# Patient Record
Sex: Female | Born: 1950 | Race: White | Hispanic: No | Smoking: Never smoker
Health system: Southern US, Community
[De-identification: ages and names within clinical notes are randomized; demographics above are authoritative.]

## PROBLEM LIST (undated history)

## (undated) DIAGNOSIS — T7840XA Allergy, unspecified, initial encounter: Secondary | ICD-10-CM

## (undated) DIAGNOSIS — I00 Rheumatic fever without heart involvement: Secondary | ICD-10-CM

## (undated) DIAGNOSIS — C801 Malignant (primary) neoplasm, unspecified: Secondary | ICD-10-CM

## (undated) HISTORY — DX: Rheumatic fever without heart involvement: I00

## (undated) HISTORY — DX: Allergy, unspecified, initial encounter: T78.40XA

---

## 1999-07-09 HISTORY — PX: MOHS SURGERY: SUR867

## 2000-10-16 ENCOUNTER — Encounter: Payer: Self-pay | Admitting: Obstetrics and Gynecology

## 2000-10-16 ENCOUNTER — Encounter (INDEPENDENT_AMBULATORY_CARE_PROVIDER_SITE_OTHER): Payer: Self-pay | Admitting: *Deleted

## 2000-10-16 ENCOUNTER — Other Ambulatory Visit: Admission: RE | Admit: 2000-10-16 | Discharge: 2000-10-16 | Payer: Self-pay | Admitting: Obstetrics and Gynecology

## 2000-10-16 ENCOUNTER — Encounter: Admission: RE | Admit: 2000-10-16 | Discharge: 2000-10-16 | Payer: Self-pay | Admitting: Obstetrics and Gynecology

## 2000-11-03 ENCOUNTER — Encounter: Payer: Self-pay | Admitting: Oncology

## 2000-11-03 ENCOUNTER — Ambulatory Visit (HOSPITAL_COMMUNITY): Admission: RE | Admit: 2000-11-03 | Discharge: 2000-11-03 | Payer: Self-pay | Admitting: Oncology

## 2000-11-05 ENCOUNTER — Ambulatory Visit (HOSPITAL_COMMUNITY): Admission: RE | Admit: 2000-11-05 | Discharge: 2000-11-05 | Payer: Self-pay | Admitting: Oncology

## 2000-11-05 ENCOUNTER — Encounter: Payer: Self-pay | Admitting: Oncology

## 2000-11-10 ENCOUNTER — Encounter (INDEPENDENT_AMBULATORY_CARE_PROVIDER_SITE_OTHER): Payer: Self-pay | Admitting: *Deleted

## 2000-11-10 ENCOUNTER — Encounter: Payer: Self-pay | Admitting: General Surgery

## 2000-11-10 ENCOUNTER — Ambulatory Visit (HOSPITAL_BASED_OUTPATIENT_CLINIC_OR_DEPARTMENT_OTHER): Admission: RE | Admit: 2000-11-10 | Discharge: 2000-11-10 | Payer: Self-pay | Admitting: General Surgery

## 2000-11-18 ENCOUNTER — Encounter: Payer: Self-pay | Admitting: General Surgery

## 2000-11-18 ENCOUNTER — Encounter: Admission: RE | Admit: 2000-11-18 | Discharge: 2000-11-18 | Payer: Self-pay | Admitting: General Surgery

## 2001-01-25 ENCOUNTER — Ambulatory Visit (HOSPITAL_COMMUNITY): Admission: RE | Admit: 2001-01-25 | Discharge: 2001-01-25 | Payer: Self-pay | Admitting: Oncology

## 2001-02-05 HISTORY — PX: BREAST LUMPECTOMY: SHX2

## 2001-02-06 ENCOUNTER — Encounter: Payer: Self-pay | Admitting: General Surgery

## 2001-02-06 ENCOUNTER — Ambulatory Visit (HOSPITAL_BASED_OUTPATIENT_CLINIC_OR_DEPARTMENT_OTHER): Admission: RE | Admit: 2001-02-06 | Discharge: 2001-02-06 | Payer: Self-pay | Admitting: General Surgery

## 2001-02-06 ENCOUNTER — Encounter (INDEPENDENT_AMBULATORY_CARE_PROVIDER_SITE_OTHER): Payer: Self-pay | Admitting: *Deleted

## 2001-02-06 ENCOUNTER — Encounter: Admission: RE | Admit: 2001-02-06 | Discharge: 2001-02-06 | Payer: Self-pay | Admitting: General Surgery

## 2001-02-23 ENCOUNTER — Ambulatory Visit: Admission: RE | Admit: 2001-02-23 | Discharge: 2001-05-24 | Payer: Self-pay | Admitting: Radiation Oncology

## 2001-10-06 ENCOUNTER — Encounter: Admission: RE | Admit: 2001-10-06 | Discharge: 2001-10-06 | Payer: Self-pay | Admitting: General Surgery

## 2001-10-06 ENCOUNTER — Encounter: Payer: Self-pay | Admitting: General Surgery

## 2002-04-09 ENCOUNTER — Encounter: Payer: Self-pay | Admitting: Oncology

## 2002-04-09 ENCOUNTER — Encounter: Admission: RE | Admit: 2002-04-09 | Discharge: 2002-04-09 | Payer: Self-pay | Admitting: Oncology

## 2002-04-28 ENCOUNTER — Other Ambulatory Visit: Admission: RE | Admit: 2002-04-28 | Discharge: 2002-04-28 | Payer: Self-pay | Admitting: Obstetrics and Gynecology

## 2002-10-11 ENCOUNTER — Encounter: Admission: RE | Admit: 2002-10-11 | Discharge: 2002-10-11 | Payer: Self-pay | Admitting: Oncology

## 2002-10-11 ENCOUNTER — Encounter: Payer: Self-pay | Admitting: Oncology

## 2003-11-15 ENCOUNTER — Ambulatory Visit (HOSPITAL_COMMUNITY): Admission: RE | Admit: 2003-11-15 | Discharge: 2003-11-15 | Payer: Self-pay | Admitting: Oncology

## 2003-12-07 ENCOUNTER — Encounter: Admission: RE | Admit: 2003-12-07 | Discharge: 2003-12-07 | Payer: Self-pay | Admitting: Oncology

## 2004-05-12 ENCOUNTER — Ambulatory Visit: Payer: Self-pay | Admitting: Oncology

## 2004-05-15 ENCOUNTER — Other Ambulatory Visit: Admission: RE | Admit: 2004-05-15 | Discharge: 2004-05-15 | Payer: Self-pay | Admitting: Obstetrics and Gynecology

## 2004-12-07 ENCOUNTER — Ambulatory Visit: Payer: Self-pay | Admitting: Oncology

## 2004-12-11 ENCOUNTER — Encounter: Admission: RE | Admit: 2004-12-11 | Discharge: 2004-12-11 | Payer: Self-pay | Admitting: Oncology

## 2005-06-07 ENCOUNTER — Ambulatory Visit: Payer: Self-pay | Admitting: Oncology

## 2005-12-03 ENCOUNTER — Ambulatory Visit: Payer: Self-pay | Admitting: Oncology

## 2005-12-09 ENCOUNTER — Ambulatory Visit (HOSPITAL_COMMUNITY): Admission: RE | Admit: 2005-12-09 | Discharge: 2005-12-09 | Payer: Self-pay | Admitting: Oncology

## 2005-12-09 LAB — COMPREHENSIVE METABOLIC PANEL
ALT: <8 U/L (ref 0–40)
AST: 17 U/L (ref 0–37)
Alkaline Phosphatase: 65 U/L (ref 39–117)
Creatinine, Ser: 0.68 mg/dL (ref 0.40–1.20)
Sodium: 141 mEq/L (ref 135–145)
Total Bilirubin: 0.8 mg/dL (ref 0.3–1.2)
Total Protein: 7.1 g/dL (ref 6.0–8.3)

## 2005-12-09 LAB — LACTATE DEHYDROGENASE: LDH: 116 U/L (ref 94–250)

## 2005-12-09 LAB — CBC WITH DIFFERENTIAL/PLATELET
BASO%: 0.3 % (ref 0.0–2.0)
EOS%: 1.7 % (ref 0.0–7.0)
HCT: 38.2 % (ref 34.8–46.6)
LYMPH%: 23.6 % (ref 14.0–48.0)
MCH: 30.6 pg (ref 26.0–34.0)
MCHC: 33.8 g/dL (ref 32.0–36.0)
MONO#: 0.4 10*3/uL (ref 0.1–0.9)
MONO%: 8.4 % (ref 0.0–13.0)
NEUT%: 66 % (ref 39.6–76.8)
Platelets: 296 10*3/uL (ref 145–400)
RBC: 4.23 10*6/uL (ref 3.70–5.32)
WBC: 5.2 10*3/uL (ref 3.9–10.0)

## 2005-12-09 LAB — ESTRADIOL: Estradiol: <10 pg/mL

## 2005-12-13 ENCOUNTER — Encounter: Admission: RE | Admit: 2005-12-13 | Discharge: 2005-12-13 | Payer: Self-pay | Admitting: Oncology

## 2006-01-16 ENCOUNTER — Encounter: Admission: RE | Admit: 2006-01-16 | Discharge: 2006-01-16 | Payer: Self-pay | Admitting: Oncology

## 2006-06-19 ENCOUNTER — Ambulatory Visit: Payer: Self-pay | Admitting: Oncology

## 2006-06-23 LAB — CBC WITH DIFFERENTIAL/PLATELET
BASO%: 0.5 % (ref 0.0–2.0)
EOS%: 1.4 % (ref 0.0–7.0)
LYMPH%: 24.2 % (ref 14.0–48.0)
MCH: 30.3 pg (ref 26.0–34.0)
MCHC: 33.9 g/dL (ref 32.0–36.0)
MCV: 89.3 fL (ref 81.0–101.0)
MONO%: 9.2 % (ref 0.0–13.0)
Platelets: 312 10*3/uL (ref 145–400)
RBC: 4.16 10*6/uL (ref 3.70–5.32)
RDW: 12.8 % (ref 11.3–14.5)

## 2006-06-23 LAB — COMPREHENSIVE METABOLIC PANEL
ALT: 9 U/L (ref 0–35)
AST: 22 U/L (ref 0–37)
Alkaline Phosphatase: 64 U/L (ref 39–117)
Glucose, Bld: 100 mg/dL — ABNORMAL HIGH (ref 70–99)
Sodium: 140 mEq/L (ref 135–145)
Total Bilirubin: 0.6 mg/dL (ref 0.3–1.2)
Total Protein: 7 g/dL (ref 6.0–8.3)

## 2006-06-23 LAB — CANCER ANTIGEN 27.29: CA 27.29: 20 U/mL (ref 0–39)

## 2006-07-04 LAB — ESTRADIOL, ULTRA SENS: Estradiol, Ultra Sensitive: 48 pg/mL

## 2006-12-10 ENCOUNTER — Ambulatory Visit: Payer: Self-pay | Admitting: Oncology

## 2006-12-15 LAB — COMPREHENSIVE METABOLIC PANEL
Albumin: 4.4 g/dL (ref 3.5–5.2)
Alkaline Phosphatase: 75 U/L (ref 39–117)
BUN: 8 mg/dL (ref 6–23)
Calcium: 9.3 mg/dL (ref 8.4–10.5)
Creatinine, Ser: 0.65 mg/dL (ref 0.40–1.20)
Glucose, Bld: 102 mg/dL — ABNORMAL HIGH (ref 70–99)
Potassium: 3.3 mEq/L — ABNORMAL LOW (ref 3.5–5.3)

## 2006-12-15 LAB — CBC WITH DIFFERENTIAL/PLATELET
Basophils Absolute: 0 10*3/uL (ref 0.0–0.1)
EOS%: 1.5 % (ref 0.0–7.0)
Eosinophils Absolute: 0.1 10*3/uL (ref 0.0–0.5)
HCT: 38.5 % (ref 34.8–46.6)
HGB: 13.4 g/dL (ref 11.6–15.9)
MCH: 30.2 pg (ref 26.0–34.0)
MCV: 86.9 fL (ref 81.0–101.0)
MONO%: 14.8 % — ABNORMAL HIGH (ref 0.0–13.0)
NEUT#: 2.6 10*3/uL (ref 1.5–6.5)
NEUT%: 47.3 % (ref 39.6–76.8)
Platelets: 314 10*3/uL (ref 145–400)
RDW: 13.3 % (ref 11.3–14.5)

## 2006-12-15 LAB — CANCER ANTIGEN 27.29: CA 27.29: 22 U/mL (ref 0–39)

## 2006-12-17 ENCOUNTER — Encounter: Admission: RE | Admit: 2006-12-17 | Discharge: 2006-12-17 | Payer: Self-pay | Admitting: Oncology

## 2006-12-22 LAB — FSH/LH: FSH: 25.6 m[IU]/mL

## 2006-12-29 LAB — ESTRADIOL, ULTRA SENS: Estradiol, Ultra Sensitive: <2 pg/mL

## 2007-06-01 ENCOUNTER — Emergency Department (HOSPITAL_COMMUNITY): Admission: EM | Admit: 2007-06-01 | Discharge: 2007-06-01 | Payer: Self-pay | Admitting: Emergency Medicine

## 2007-06-11 ENCOUNTER — Ambulatory Visit: Payer: Self-pay | Admitting: Oncology

## 2007-06-15 LAB — COMPREHENSIVE METABOLIC PANEL
ALT: 13 U/L (ref 0–35)
AST: 22 U/L (ref 0–37)
Albumin: 4.3 g/dL (ref 3.5–5.2)
Alkaline Phosphatase: 69 U/L (ref 39–117)
BUN: 8 mg/dL (ref 6–23)
CO2: 27 mEq/L (ref 19–32)
Calcium: 9.4 mg/dL (ref 8.4–10.5)
Chloride: 106 mEq/L (ref 96–112)
Creatinine, Ser: 0.69 mg/dL (ref 0.40–1.20)
Glucose, Bld: 98 mg/dL (ref 70–99)
Potassium: 3.6 mEq/L (ref 3.5–5.3)
Sodium: 144 mEq/L (ref 135–145)
Total Bilirubin: 1.1 mg/dL (ref 0.3–1.2)
Total Protein: 7.4 g/dL (ref 6.0–8.3)

## 2007-06-15 LAB — CBC WITH DIFFERENTIAL/PLATELET
BASO%: 1.4 % (ref 0.0–2.0)
Basophils Absolute: 0.1 10*3/uL (ref 0.0–0.1)
EOS%: 0.9 % (ref 0.0–7.0)
Eosinophils Absolute: 0.1 10*3/uL (ref 0.0–0.5)
HCT: 39.4 % (ref 34.8–46.6)
HGB: 13.6 g/dL (ref 11.6–15.9)
LYMPH%: 30.4 % (ref 14.0–48.0)
MCH: 30.2 pg (ref 26.0–34.0)
MCHC: 34.6 g/dL (ref 32.0–36.0)
MCV: 87.3 fL (ref 81.0–101.0)
MONO#: 0.4 10*3/uL (ref 0.1–0.9)
MONO%: 5.6 % (ref 0.0–13.0)
NEUT#: 4.3 10*3/uL (ref 1.5–6.5)
NEUT%: 61.7 % (ref 39.6–76.8)
Platelets: 334 10*3/uL (ref 145–400)
RBC: 4.51 10*6/uL (ref 3.70–5.32)
RDW: 13.8 % (ref 11.3–14.5)
WBC: 6.9 10*3/uL (ref 3.9–10.0)
lymph#: 2.1 10*3/uL (ref 0.9–3.3)

## 2007-06-15 LAB — LACTATE DEHYDROGENASE: LDH: 140 U/L (ref 94–250)

## 2007-06-15 LAB — FSH/LH
FSH: 37 m[IU]/mL
LH: 31.5 m[IU]/mL

## 2007-08-24 ENCOUNTER — Ambulatory Visit: Payer: Self-pay | Admitting: Oncology

## 2007-12-16 ENCOUNTER — Ambulatory Visit: Payer: Self-pay | Admitting: Oncology

## 2007-12-18 LAB — CBC WITH DIFFERENTIAL/PLATELET
BASO%: 0.6 % (ref 0.0–2.0)
Basophils Absolute: 0 10*3/uL (ref 0.0–0.1)
EOS%: 1.4 % (ref 0.0–7.0)
HCT: 38 % (ref 34.8–46.6)
HGB: 13.1 g/dL (ref 11.6–15.9)
MCH: 29.4 pg (ref 26.0–34.0)
MONO#: 0.5 10*3/uL (ref 0.1–0.9)
RDW: 12.8 % (ref 11.3–14.5)
WBC: 6.7 10*3/uL (ref 3.9–10.0)
lymph#: 1.8 10*3/uL (ref 0.9–3.3)

## 2007-12-18 LAB — COMPREHENSIVE METABOLIC PANEL
AST: 20 U/L (ref 0–37)
Albumin: 4.4 g/dL (ref 3.5–5.2)
Alkaline Phosphatase: 83 U/L (ref 39–117)
Calcium: 9.5 mg/dL (ref 8.4–10.5)
Chloride: 105 mEq/L (ref 96–112)
Glucose, Bld: 113 mg/dL — ABNORMAL HIGH (ref 70–99)
Potassium: 3.4 mEq/L — ABNORMAL LOW (ref 3.5–5.3)
Sodium: 142 mEq/L (ref 135–145)
Total Protein: 7.4 g/dL (ref 6.0–8.3)

## 2007-12-21 ENCOUNTER — Encounter: Admission: RE | Admit: 2007-12-21 | Discharge: 2007-12-21 | Payer: Self-pay | Admitting: Oncology

## 2008-01-25 ENCOUNTER — Encounter: Admission: RE | Admit: 2008-01-25 | Discharge: 2008-01-25 | Payer: Self-pay | Admitting: Oncology

## 2008-06-20 IMAGING — MG MM DIAGNOSTIC BILATERAL
5 series · 5 of 5 positions shown · non-contrast
Comparison: [DATE] [DATE], [DATE], [DATE] [DATE], [DATE]

CLINICAL DATA: Personal history of right breast cancer status post
lumpectomy 8778

DIGITAL DIAGNOSTIC BILATERAL MAMMOGRAM WITH COMPUTER AIDED
DETECTION December 21, 2007

[R CC]
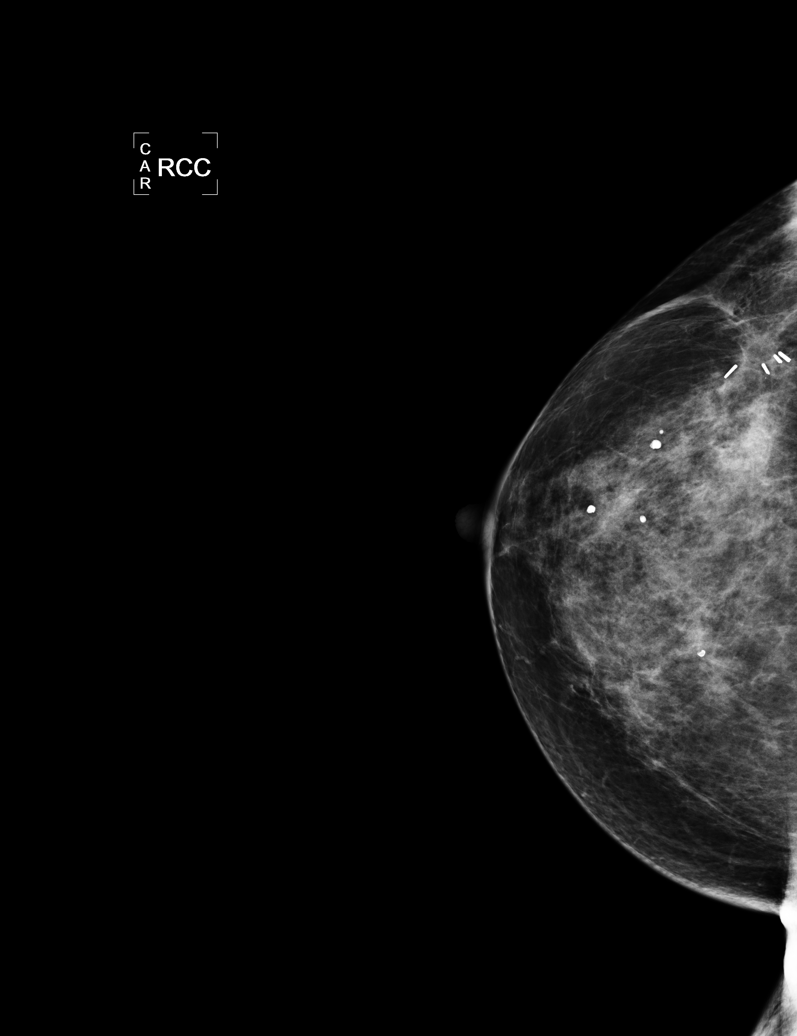

[L CC]
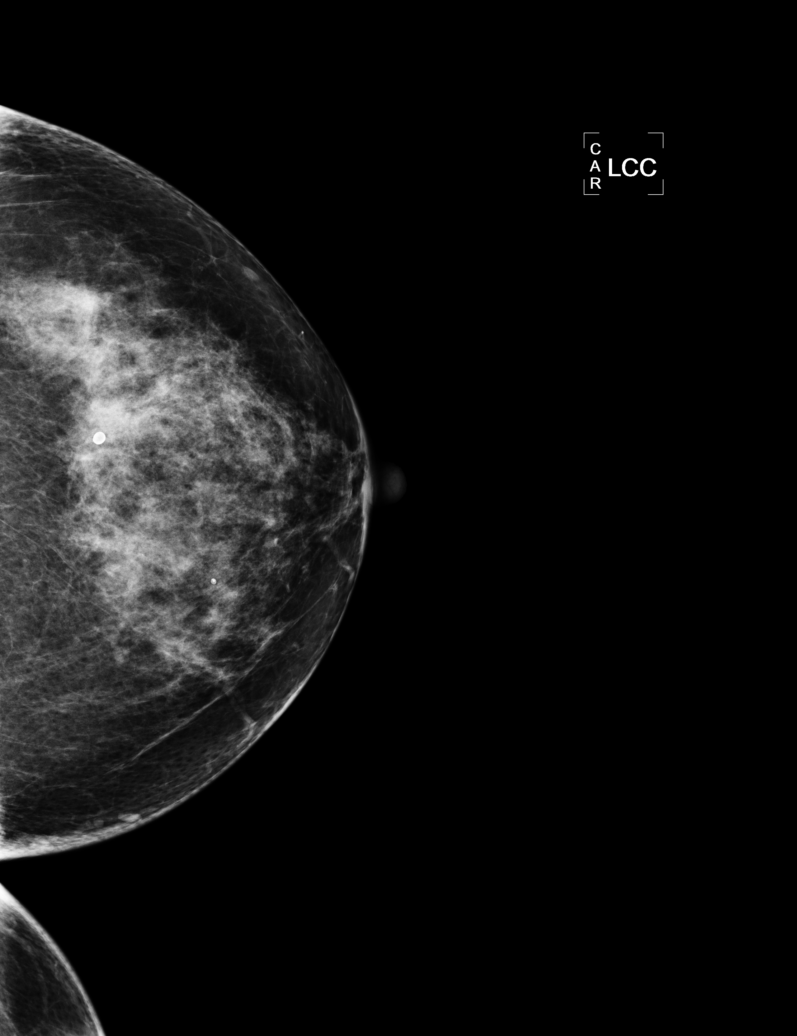

[L MLO]
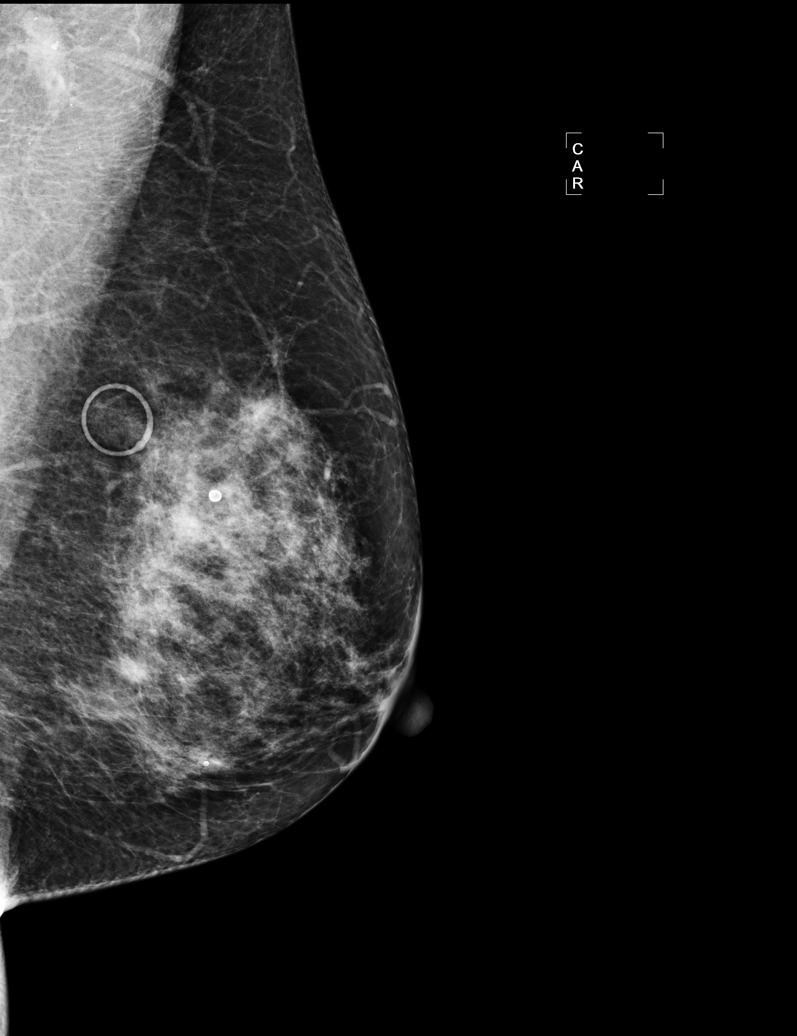

[R MLO]
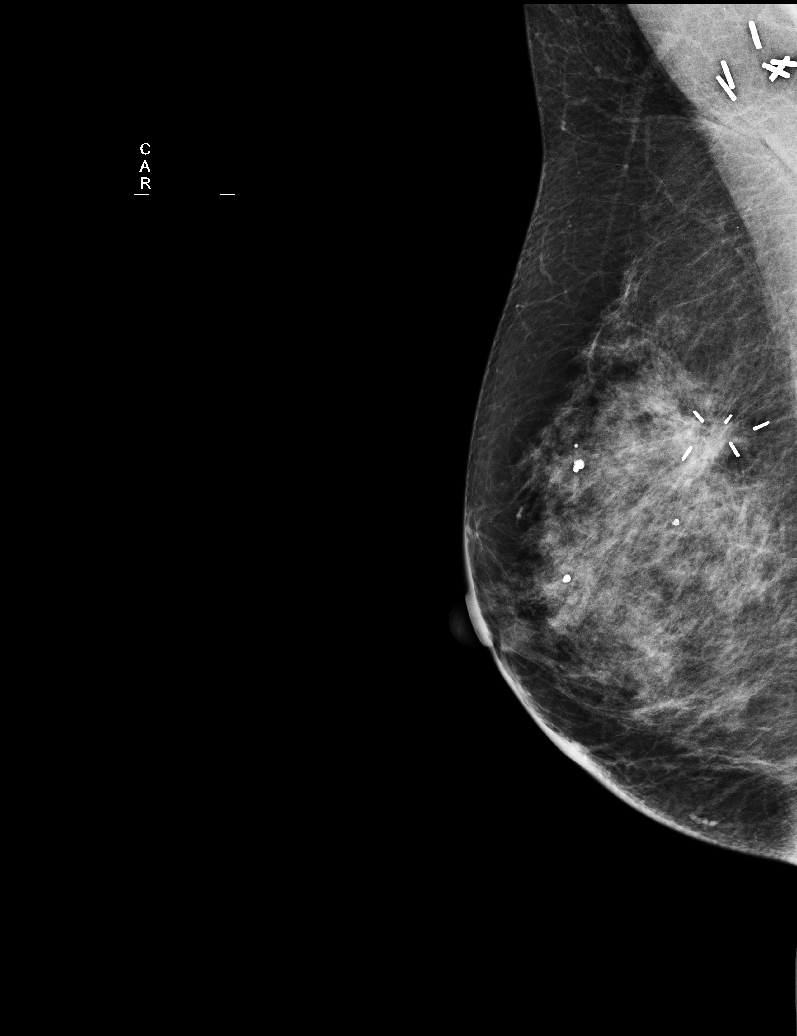

[R TAN]
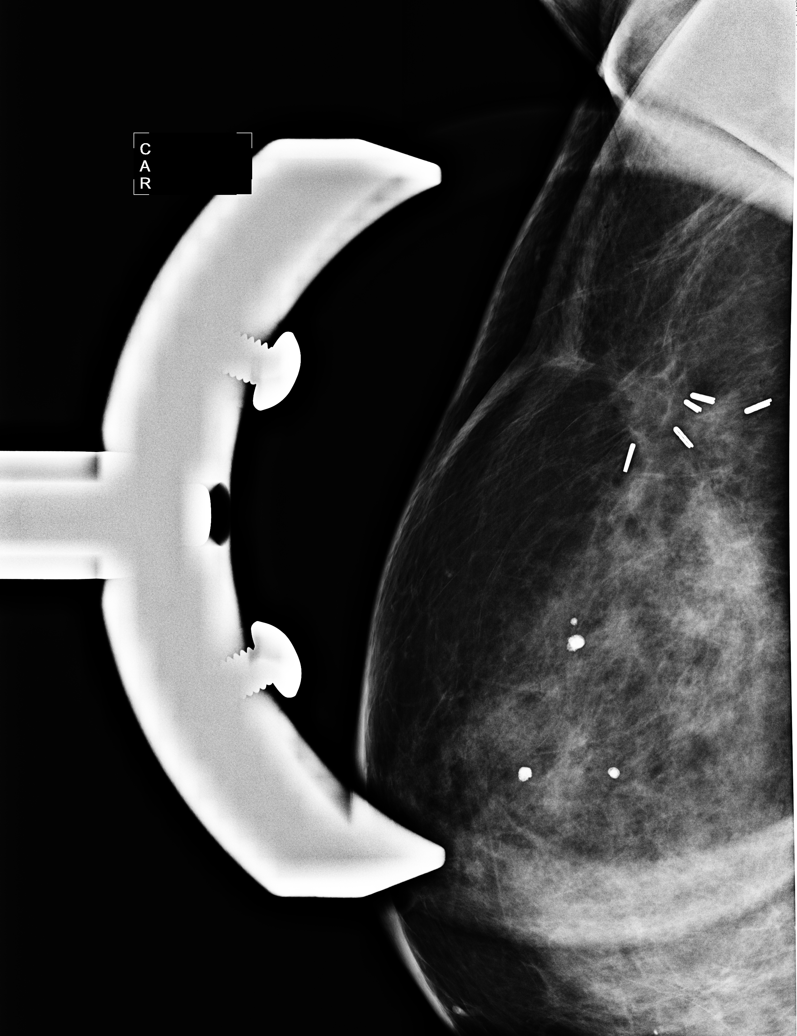

[5 of 5 positions shown; findings below may reference images not displayed]

FINDINGS: CC and MLO views of bilateral breast and spot tangential
view of right breast are submitted reviewed.  Breast parenchyma is
heterogeneously dense lowering the sensitivity of mammography.
Stable postsurgical scar is noted in the upper outer quadrant right
breast without change compared prior exam.  The left breast is
stable.
IMPRESSION: Benign findings, recommend bilateral screening mammogram in 1 year

BI-RADS CATEGORY 2:  Benign finding(s).

## 2008-12-13 ENCOUNTER — Ambulatory Visit: Payer: Self-pay | Admitting: Oncology

## 2008-12-15 LAB — CBC WITH DIFFERENTIAL/PLATELET
Basophils Absolute: 0 10*3/uL (ref 0.0–0.1)
EOS%: 1.2 % (ref 0.0–7.0)
Eosinophils Absolute: 0.1 10*3/uL (ref 0.0–0.5)
HCT: 37.5 % (ref 34.8–46.6)
HGB: 13.2 g/dL (ref 11.6–15.9)
LYMPH%: 20.9 % (ref 14.0–49.7)
MCH: 30.8 pg (ref 25.1–34.0)
MCV: 87.6 fL (ref 79.5–101.0)
MONO%: 7.6 % (ref 0.0–14.0)
NEUT#: 5.4 10*3/uL (ref 1.5–6.5)
NEUT%: 69.7 % (ref 38.4–76.8)
Platelets: 280 10*3/uL (ref 145–400)
RDW: 12.8 % (ref 11.2–14.5)

## 2008-12-16 LAB — COMPREHENSIVE METABOLIC PANEL
AST: 18 U/L (ref 0–37)
Albumin: 4.3 g/dL (ref 3.5–5.2)
Alkaline Phosphatase: 78 U/L (ref 39–117)
BUN: 11 mg/dL (ref 6–23)
Creatinine, Ser: 0.89 mg/dL (ref 0.40–1.20)
Glucose, Bld: 91 mg/dL (ref 70–99)
Potassium: 3.8 mEq/L (ref 3.5–5.3)
Total Bilirubin: 0.8 mg/dL (ref 0.3–1.2)

## 2008-12-16 LAB — CANCER ANTIGEN 27.29: CA 27.29: 28 U/mL (ref 0–39)

## 2008-12-16 LAB — VITAMIN D 25 HYDROXY (VIT D DEFICIENCY, FRACTURES): Vit D, 25-Hydroxy: 28 ng/mL — ABNORMAL LOW (ref 30–89)

## 2008-12-21 ENCOUNTER — Encounter: Admission: RE | Admit: 2008-12-21 | Discharge: 2008-12-21 | Payer: Self-pay | Admitting: Oncology

## 2009-12-06 HISTORY — PX: BASAL CELL CARCINOMA EXCISION: SHX1214

## 2009-12-15 ENCOUNTER — Ambulatory Visit: Payer: Self-pay | Admitting: Oncology

## 2009-12-19 LAB — LACTATE DEHYDROGENASE: LDH: 126 U/L (ref 94–250)

## 2009-12-19 LAB — CBC WITH DIFFERENTIAL/PLATELET
BASO%: 0.5 % (ref 0.0–2.0)
MCH: 30.1 pg (ref 25.1–34.0)
MONO#: 0.6 10*3/uL (ref 0.1–0.9)
MONO%: 8.2 % (ref 0.0–14.0)
NEUT%: 55 % (ref 38.4–76.8)
RBC: 4.37 10*6/uL (ref 3.70–5.45)
lymph#: 2.6 10*3/uL (ref 0.9–3.3)

## 2009-12-19 LAB — COMPREHENSIVE METABOLIC PANEL
ALT: 13 U/L (ref 0–35)
AST: 24 U/L (ref 0–37)
Alkaline Phosphatase: 68 U/L (ref 39–117)
Chloride: 104 mEq/L (ref 96–112)
Creatinine, Ser: 0.8 mg/dL (ref 0.40–1.20)
Glucose, Bld: 88 mg/dL (ref 70–99)
Potassium: 3.5 mEq/L (ref 3.5–5.3)
Sodium: 141 mEq/L (ref 135–145)
Total Bilirubin: 1.2 mg/dL (ref 0.3–1.2)
Total Protein: 7.6 g/dL (ref 6.0–8.3)

## 2009-12-22 ENCOUNTER — Encounter: Admission: RE | Admit: 2009-12-22 | Discharge: 2009-12-22 | Payer: Self-pay | Admitting: Oncology

## 2009-12-28 ENCOUNTER — Encounter: Admission: RE | Admit: 2009-12-28 | Discharge: 2009-12-28 | Payer: Self-pay | Admitting: Oncology

## 2010-01-09 ENCOUNTER — Ambulatory Visit (HOSPITAL_COMMUNITY): Admission: RE | Admit: 2010-01-09 | Discharge: 2010-01-09 | Payer: Self-pay | Admitting: Oncology

## 2010-01-12 ENCOUNTER — Ambulatory Visit: Admission: RE | Admit: 2010-01-12 | Discharge: 2010-04-12 | Payer: Self-pay | Admitting: Radiation Oncology

## 2010-01-15 ENCOUNTER — Ambulatory Visit: Payer: Self-pay | Admitting: Oncology

## 2010-01-15 LAB — CBC WITH DIFFERENTIAL/PLATELET
Basophils Absolute: 0 10*3/uL (ref 0.0–0.1)
EOS%: 0.5 % (ref 0.0–7.0)
Eosinophils Absolute: 0 10*3/uL (ref 0.0–0.5)
HGB: 12.7 g/dL (ref 11.6–15.9)
LYMPH%: 28.5 % (ref 14.0–49.7)
MCH: 30.5 pg (ref 25.1–34.0)
MCHC: 34.7 g/dL (ref 31.5–36.0)
MONO%: 9.4 % (ref 0.0–14.0)
NEUT%: 61.2 % (ref 38.4–76.8)
Platelets: 294 10*3/uL (ref 145–400)
RBC: 4.16 10*6/uL (ref 3.70–5.45)
RDW: 12.8 % (ref 11.2–14.5)

## 2010-01-15 LAB — COMPREHENSIVE METABOLIC PANEL
Albumin: 3.9 g/dL (ref 3.5–5.2)
Alkaline Phosphatase: 66 U/L (ref 39–117)
BUN: 6 mg/dL (ref 6–23)
Calcium: 9.4 mg/dL (ref 8.4–10.5)
Chloride: 107 mEq/L (ref 96–112)
Creatinine, Ser: 0.67 mg/dL (ref 0.40–1.20)
Total Bilirubin: 0.9 mg/dL (ref 0.3–1.2)

## 2010-01-15 LAB — LACTATE DEHYDROGENASE: LDH: 122 U/L (ref 94–250)

## 2010-02-13 ENCOUNTER — Ambulatory Visit (HOSPITAL_BASED_OUTPATIENT_CLINIC_OR_DEPARTMENT_OTHER): Admission: RE | Admit: 2010-02-13 | Discharge: 2010-02-14 | Payer: Self-pay | Admitting: Surgery

## 2010-04-13 ENCOUNTER — Ambulatory Visit: Admission: RE | Admit: 2010-04-13 | Discharge: 2010-05-07 | Payer: Self-pay | Admitting: Radiation Oncology

## 2010-05-09 ENCOUNTER — Ambulatory Visit: Payer: Self-pay | Admitting: Oncology

## 2010-07-27 ENCOUNTER — Other Ambulatory Visit: Payer: Self-pay | Admitting: Oncology

## 2010-07-27 DIAGNOSIS — C50919 Malignant neoplasm of unspecified site of unspecified female breast: Secondary | ICD-10-CM

## 2010-07-30 ENCOUNTER — Encounter: Payer: Self-pay | Admitting: Oncology

## 2010-09-07 ENCOUNTER — Other Ambulatory Visit (HOSPITAL_COMMUNITY): Payer: Self-pay

## 2010-10-17 ENCOUNTER — Encounter (HOSPITAL_BASED_OUTPATIENT_CLINIC_OR_DEPARTMENT_OTHER): Payer: BLUE CROSS/BLUE SHIELD | Admitting: Oncology

## 2010-10-17 ENCOUNTER — Encounter (HOSPITAL_COMMUNITY): Payer: Self-pay

## 2010-10-17 ENCOUNTER — Other Ambulatory Visit: Payer: Self-pay | Admitting: Oncology

## 2010-10-17 ENCOUNTER — Encounter (HOSPITAL_COMMUNITY)
Admission: RE | Admit: 2010-10-17 | Discharge: 2010-10-17 | Disposition: A | Discharge status: Home / Self Care | Payer: BLUE CROSS/BLUE SHIELD | Source: Ambulatory Visit | Attending: Oncology | Admitting: Oncology

## 2010-10-17 DIAGNOSIS — C50919 Malignant neoplasm of unspecified site of unspecified female breast: Secondary | ICD-10-CM

## 2010-10-17 DIAGNOSIS — C761 Malignant neoplasm of thorax: Secondary | ICD-10-CM | POA: Insufficient documentation

## 2010-10-17 DIAGNOSIS — C50419 Malignant neoplasm of upper-outer quadrant of unspecified female breast: Secondary | ICD-10-CM

## 2010-10-17 DIAGNOSIS — N289 Disorder of kidney and ureter, unspecified: Secondary | ICD-10-CM | POA: Insufficient documentation

## 2010-10-17 DIAGNOSIS — Z9221 Personal history of antineoplastic chemotherapy: Secondary | ICD-10-CM | POA: Insufficient documentation

## 2010-10-17 DIAGNOSIS — C44611 Basal cell carcinoma of skin of unspecified upper limb, including shoulder: Secondary | ICD-10-CM | POA: Insufficient documentation

## 2010-10-17 DIAGNOSIS — K449 Diaphragmatic hernia without obstruction or gangrene: Secondary | ICD-10-CM | POA: Insufficient documentation

## 2010-10-17 DIAGNOSIS — Z79899 Other long term (current) drug therapy: Secondary | ICD-10-CM | POA: Insufficient documentation

## 2010-10-17 DIAGNOSIS — R05 Cough: Secondary | ICD-10-CM | POA: Insufficient documentation

## 2010-10-17 DIAGNOSIS — R059 Cough, unspecified: Secondary | ICD-10-CM | POA: Insufficient documentation

## 2010-10-17 DIAGNOSIS — J8410 Pulmonary fibrosis, unspecified: Secondary | ICD-10-CM | POA: Insufficient documentation

## 2010-10-17 DIAGNOSIS — Z923 Personal history of irradiation: Secondary | ICD-10-CM | POA: Insufficient documentation

## 2010-10-17 HISTORY — DX: Malignant (primary) neoplasm, unspecified: C80.1

## 2010-10-17 LAB — CMP (CANCER CENTER ONLY)
Albumin: 3.7 g/dL (ref 3.3–5.5)
BUN, Bld: 11 mg/dL (ref 7–22)
CO2: 29 mEq/L (ref 18–33)
Calcium: 9.3 mg/dL (ref 8.0–10.3)
Chloride: 100 mEq/L (ref 98–108)
Glucose, Bld: 99 mg/dL (ref 73–118)
Potassium: 3.5 mEq/L (ref 3.3–4.7)

## 2010-10-17 LAB — CBC WITH DIFFERENTIAL/PLATELET
Basophils Absolute: 0 10*3/uL (ref 0.0–0.1)
Eosinophils Absolute: 0.1 10*3/uL (ref 0.0–0.5)
HGB: 13.2 g/dL (ref 11.6–15.9)
NEUT#: 3.3 10*3/uL (ref 1.5–6.5)
RDW: 13.5 % (ref 11.2–14.5)
lymph#: 1.4 10*3/uL (ref 0.9–3.3)

## 2010-10-17 LAB — CANCER ANTIGEN 27.29: CA 27.29: 30 U/mL (ref 0–39)

## 2010-10-17 MED ORDER — FLUDEOXYGLUCOSE F - 18 (FDG) INJECTION
14.5000 | Freq: Once | INTRAVENOUS | Status: AC | PRN
  Administered 2010-10-17: 14.5 via INTRAVENOUS

## 2010-10-17 MED ORDER — IOHEXOL 300 MG/ML  SOLN
100.0000 mL | Freq: Once | INTRAMUSCULAR | Status: AC | PRN
  Administered 2010-10-17: 100 mL via INTRAVENOUS

## 2010-10-26 ENCOUNTER — Other Ambulatory Visit: Payer: Self-pay | Admitting: Oncology

## 2010-10-26 ENCOUNTER — Encounter (HOSPITAL_BASED_OUTPATIENT_CLINIC_OR_DEPARTMENT_OTHER): Payer: BLUE CROSS/BLUE SHIELD | Admitting: Oncology

## 2010-10-26 DIAGNOSIS — C50919 Malignant neoplasm of unspecified site of unspecified female breast: Secondary | ICD-10-CM

## 2010-10-26 DIAGNOSIS — Z1231 Encounter for screening mammogram for malignant neoplasm of breast: Secondary | ICD-10-CM

## 2010-11-05 ENCOUNTER — Ambulatory Visit: Payer: BLUE CROSS/BLUE SHIELD | Attending: Radiation Oncology | Admitting: Radiation Oncology

## 2010-11-23 NOTE — Op Note (Signed)
[place]  Patient:    Jennifer Hopkins, Jennifer Hopkins                 MRN: [id number] Proc. Date: 11/10/00 Adm. Date:  [id number] Attending:  Janalyn Rouse CC:         Pierce Crane, M.D.   Operative Report  PREOPERATIVE DIAGNOSIS:  T2 carcinoma of the right breast.  POSTOPERATIVE DIAGNOSIS:  T2 carcinoma of the right breast.  OPERATION PERFORMED: 1. Blue dye injection. 2. Right axillary sentinel lymph node biopsy.  SURGEON:  Rose Phi. Maple Hudson, M.D.  ANESTHESIA:  General.  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  She is very interested in having breast conservation therapy and wanted preoperative chemotherapy.  In order to do this, Dr. Donnie Coffin preferred that we do a sentinel node biopsy to establish her nodal status.  DESCRIPTION OF PROCEDURE:  Prior to coming to the operating room, 1 mCi of technetium sulfur colloid was injected intradermally around the areola.  We injected 5 cc Lymphazurin blue in the subareolar tissue and massaged the breast for 5 minutes.  After suitable general anesthesia was induced, the patient was placed in the supine position with the right breast prepped and draped in the usual fashion.  Having injected the blue dye and massaged the breast, we then scanned the internal mammary, the supraclavicular and the axillary areas with the Neoprobe.  There was one hot spot in the axilla.  A short transverse incision was made in the axilla with dissection through the subcutaneous tissues to the clavipectoral fascia.  Blue lymphatics could be identified and we carefully dissected those into about three blue and hot lymph nodes.  We clipped the afferent and efferent lymphatics and removed what appeared to be three lymph nodes.  There were no other hot, blue or palpable nodes.   These were submitted to the pathologist.  With good hemostasis, the subcutaneous tissues were closed with 3-0 Vicryl and the skin with the subcuticular 4-0 Monocryl and Steri-Strips.  Dressing applied.  The patient was transferred to the recovery room in satisfactory condition having tolerated the procedure well. DD:  11/10/00 TD:  11/11/00 Job: [id number]

## 2010-11-23 NOTE — Op Note (Signed)
[place]. [facility]  Patient:    Jennifer Hopkins, Jennifer Hopkins                 MRN: [id number] Proc. Date: 02/06/01 Adm. Date:  [id number] Disc. Date: [id number] Attending:  Janalyn Rouse CC:         Pierce Crane, M.D.   Operative Report  PREOPERATIVE DIAGNOSIS:  T2, N0 carcinoma of the right breast.  POSTOPERATIVE DIAGNOSIS:  T2, N0 carcinoma of the right breast.  OPERATION:  Right partial mastectomy with needle localization and specimen mammography.  SURGEON:  Rose Phi. Maple Hudson, M.D.  ANESTHESIA:  General.  DESCRIPTION OF PROCEDURE:  This 59 year old female had presented with a large palpable mass at the 10 oclock position of her right breast which, on core biopsy, was invasive ductal carcinoma.  A sentinel node biopsy was done which was negative.  She was treated with four cycles of Adriamycin-Cytoxan chemotherapy with total resolution, clinically, of the right breast mass, and we had placed a clip in the middle of the area of the tumor for lateral identification.  We are now doing a needle localized right partial mastectomy.  After suitable general anesthesia was induced, the patient was placed in the supine position with the right arm extended on the arm board.  A curved incision was made centered where the previously placed wire had been, and a wide excision was carried out incorporating the wire and the surrounding tissue.  The specimen was oriented for the pathologist, and several clips were placed to mark the margins in the lumpectomy cavity.  Touch preps on the margin showed them to be negative.  We had good hemostasis.  Subcuticular closure with 4-0 Monocryl and Steri-Strips carried out.  Dressing applied. The patient was transferred to the recovery room in satisfactory condition having tolerated the procedure well. DD:  02/06/01 TD:  02/08/01 Job: [id number]

## 2010-12-24 ENCOUNTER — Ambulatory Visit
Admission: RE | Admit: 2010-12-24 | Discharge: 2010-12-24 | Disposition: A | Discharge status: Home / Self Care | Payer: BLUE CROSS/BLUE SHIELD | Source: Ambulatory Visit | Attending: Oncology | Admitting: Oncology

## 2010-12-24 DIAGNOSIS — Z1231 Encounter for screening mammogram for malignant neoplasm of breast: Secondary | ICD-10-CM

## 2010-12-25 ENCOUNTER — Other Ambulatory Visit: Payer: Self-pay | Admitting: Oncology

## 2010-12-25 DIAGNOSIS — R928 Other abnormal and inconclusive findings on diagnostic imaging of breast: Secondary | ICD-10-CM

## 2011-01-01 ENCOUNTER — Ambulatory Visit
Admission: RE | Admit: 2011-01-01 | Discharge: 2011-01-01 | Disposition: A | Discharge status: Home / Self Care | Payer: BLUE CROSS/BLUE SHIELD | Source: Ambulatory Visit | Attending: Oncology | Admitting: Oncology

## 2011-01-01 DIAGNOSIS — R928 Other abnormal and inconclusive findings on diagnostic imaging of breast: Secondary | ICD-10-CM

## 2011-04-16 LAB — DIFFERENTIAL
Eosinophils Absolute: 0 — ABNORMAL LOW
Eosinophils Relative: 0
Lymphs Abs: 0.4 — ABNORMAL LOW
Monocytes Absolute: 0.4

## 2011-04-16 LAB — URINALYSIS, ROUTINE W REFLEX MICROSCOPIC
Bilirubin Urine: NEGATIVE
Glucose, UA: NEGATIVE
Protein, ur: NEGATIVE

## 2011-04-16 LAB — BASIC METABOLIC PANEL
BUN: 12
Calcium: 8.4
GFR calc non Af Amer: >60
Glucose, Bld: 107 — ABNORMAL HIGH

## 2011-04-16 LAB — CBC
Platelets: 242
RDW: 13.9

## 2011-04-19 ENCOUNTER — Encounter (HOSPITAL_BASED_OUTPATIENT_CLINIC_OR_DEPARTMENT_OTHER): Payer: BC Managed Care – PPO | Admitting: Oncology

## 2011-04-19 ENCOUNTER — Other Ambulatory Visit: Payer: Self-pay | Admitting: Oncology

## 2011-04-19 ENCOUNTER — Other Ambulatory Visit (HOSPITAL_COMMUNITY): Payer: BLUE CROSS/BLUE SHIELD

## 2011-04-19 DIAGNOSIS — C50419 Malignant neoplasm of upper-outer quadrant of unspecified female breast: Secondary | ICD-10-CM

## 2011-04-19 LAB — CBC WITH DIFFERENTIAL/PLATELET
BASO%: 0.3 % (ref 0.0–2.0)
Basophils Absolute: 0 10*3/uL (ref 0.0–0.1)
EOS%: 0.6 % (ref 0.0–7.0)
HCT: 36.8 % (ref 34.8–46.6)
LYMPH%: 24.7 % (ref 14.0–49.7)
MCH: 30.3 pg (ref 25.1–34.0)
MCHC: 34.2 g/dL (ref 31.5–36.0)
MCV: 88.8 fL (ref 79.5–101.0)
MONO%: 8.5 % (ref 0.0–14.0)
NEUT%: 65.9 % (ref 38.4–76.8)
Platelets: 282 10*3/uL (ref 145–400)
lymph#: 1.2 10*3/uL (ref 0.9–3.3)

## 2011-04-20 LAB — COMPREHENSIVE METABOLIC PANEL
ALT: <8 U/L (ref 0–35)
AST: 17 U/L (ref 0–37)
Alkaline Phosphatase: 71 U/L (ref 39–117)
BUN: 10 mg/dL (ref 6–23)
Creatinine, Ser: 0.67 mg/dL (ref 0.50–1.10)
Total Bilirubin: 1 mg/dL (ref 0.3–1.2)

## 2011-04-26 ENCOUNTER — Encounter (HOSPITAL_BASED_OUTPATIENT_CLINIC_OR_DEPARTMENT_OTHER): Payer: BC Managed Care – PPO | Admitting: Oncology

## 2011-04-26 DIAGNOSIS — C50419 Malignant neoplasm of upper-outer quadrant of unspecified female breast: Secondary | ICD-10-CM

## 2011-06-12 ENCOUNTER — Telehealth: Payer: Self-pay | Admitting: *Deleted

## 2011-06-12 NOTE — Telephone Encounter (Signed)
patient confirmed over the phone the new date and time on 08-28-2011 starting at 11:00am

## 2011-07-11 ENCOUNTER — Encounter: Payer: Self-pay | Admitting: Oncology

## 2011-07-11 ENCOUNTER — Encounter (HOSPITAL_COMMUNITY)
Admission: RE | Admit: 2011-07-11 | Discharge: 2011-07-11 | Disposition: A | Discharge status: Home / Self Care | Payer: BC Managed Care – PPO | Source: Ambulatory Visit | Attending: Oncology | Admitting: Oncology

## 2011-07-11 ENCOUNTER — Encounter (HOSPITAL_COMMUNITY): Payer: Self-pay

## 2011-07-11 DIAGNOSIS — C50919 Malignant neoplasm of unspecified site of unspecified female breast: Secondary | ICD-10-CM

## 2011-07-11 DIAGNOSIS — C4492 Squamous cell carcinoma of skin, unspecified: Secondary | ICD-10-CM | POA: Insufficient documentation

## 2011-07-11 MED ORDER — FLUDEOXYGLUCOSE F - 18 (FDG) INJECTION
18.6000 | Freq: Once | INTRAVENOUS | Status: AC | PRN
  Administered 2011-07-11: 18.6 via INTRAVENOUS

## 2011-07-11 MED ORDER — IOHEXOL 300 MG/ML  SOLN
100.0000 mL | Freq: Once | INTRAMUSCULAR | Status: AC | PRN
  Administered 2011-07-11: 100 mL via INTRAVENOUS

## 2011-07-12 ENCOUNTER — Encounter: Payer: Self-pay | Admitting: Radiation Oncology

## 2011-07-12 NOTE — Progress Notes (Signed)
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 Right lumpectomy 02/06/2001 chemo/radiation  Tamoxifen x 5 years. Radiation:03/28/2010 thru 05/07/2010 left axilla 5040 cGy in 28 fractions.

## 2011-07-15 ENCOUNTER — Ambulatory Visit
Admission: RE | Admit: 2011-07-15 | Discharge: 2011-07-15 | Disposition: A | Discharge status: Home / Self Care | Payer: BC Managed Care – PPO | Source: Ambulatory Visit | Attending: Radiation Oncology | Admitting: Radiation Oncology

## 2011-07-15 VITALS — BP 135/87 | HR 102 | Temp 97.5°F | Wt 138.0 lb

## 2011-07-15 DIAGNOSIS — C50911 Malignant neoplasm of unspecified site of right female breast: Secondary | ICD-10-CM

## 2011-07-15 DIAGNOSIS — Z853 Personal history of malignant neoplasm of breast: Secondary | ICD-10-CM

## 2011-07-15 DIAGNOSIS — C50919 Malignant neoplasm of unspecified site of unspecified female breast: Secondary | ICD-10-CM

## 2011-07-15 NOTE — Progress Notes (Signed)
CC:   Pierce Crane, M.D., F.R.C.P.C. Currie Paris, M.D. Huel Cote, M.D.  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  INTERVAL SINCE RADIATION THERAPY:  1 year and 2 months for the patient's recurrent skin cancer.  NARRATIVE:  Jennifer Hopkins comes in today for routine followup.  She clinically seems to be doing quite well at this time.  The patient occasionally will have some itching along the skin grafted area in the left upper shoulder region.  The patient will be seeing Dr. Donnie Coffin in February.  The patient prior to this visit did undergo a CT scan of the chest, abdomen, and pelvis as well as PET scan earlier this month. There was no evidence of recurrence of the patient's skin cancer or breast cancer on her imaging studies.  The patient denies any pain in the breast area, nipple discharge, or bleeding.  The patient denies any new bony pain, headaches, dizziness, or blurred vision.  The patient denies any problems with swelling in her left arm or hand.  The patient most recently was treated with radiation therapy directed at the left axillary area in light of her recurrent skin cancer.  PHYSICAL EXAMINATION:  Vital Signs:  The patient's temperature is 97.5, pulse is 102, blood pressure is 135/87, weight is 138 pounds.  Skin: Examination of the left shoulder area reveals skin graft area without palpable or visible signs of recurrence.  There are some telangiectasias noted in this area.  The neck, supraclavicular, and axillary areas are free of adenopathy.  There is mild induration along the left axillary region from the patient's radiation and surgical changes.  Breasts: Examination of the right breast reveals no palpable mass or nipple discharge.  The patient has an excellent cosmetic result.  There is a faint scar in the upper outer quadrant.  Examination of the left breast reveals no palpable mass or nipple  discharge.  IMPRESSION AND PLAN:  Clinically and radiographically no evident disease.  As above, the patient will be seeing Dr. Donnie Coffin in February. The patient will undergo mammography in the summertime.  In light of the patient's close followup with Dr. Donnie Coffin, I have not scheduled Jennifer Hopkins for a formal followup appointment, but will be glad to see her at any time.    ______________________________ Billie Lade, Ph.D., M.D. JDK/MEDQ  D:  07/15/2011  T:  07/15/2011  Job:  [id number]

## 2011-07-15 NOTE — Progress Notes (Signed)
Completed radiation ot left axilla 1 year ago. Denies any problems.skin completely healed. Ct and Pet completed last week.

## 2011-08-28 ENCOUNTER — Other Ambulatory Visit (HOSPITAL_BASED_OUTPATIENT_CLINIC_OR_DEPARTMENT_OTHER): Payer: BC Managed Care – PPO | Admitting: Lab

## 2011-08-28 ENCOUNTER — Telehealth: Payer: Self-pay | Admitting: Oncology

## 2011-08-28 ENCOUNTER — Ambulatory Visit (HOSPITAL_BASED_OUTPATIENT_CLINIC_OR_DEPARTMENT_OTHER): Payer: BC Managed Care – PPO | Admitting: Oncology

## 2011-08-28 VITALS — BP 136/83 | HR 96 | Temp 98.0°F | Ht 62.0 in | Wt 131.8 lb

## 2011-08-28 DIAGNOSIS — C4491 Basal cell carcinoma of skin, unspecified: Secondary | ICD-10-CM

## 2011-08-28 DIAGNOSIS — Z8589 Personal history of malignant neoplasm of other organs and systems: Secondary | ICD-10-CM

## 2011-08-28 DIAGNOSIS — C50419 Malignant neoplasm of upper-outer quadrant of unspecified female breast: Secondary | ICD-10-CM

## 2011-08-28 DIAGNOSIS — Z853 Personal history of malignant neoplasm of breast: Secondary | ICD-10-CM

## 2011-08-28 DIAGNOSIS — C50919 Malignant neoplasm of unspecified site of unspecified female breast: Secondary | ICD-10-CM

## 2011-08-28 DIAGNOSIS — E559 Vitamin D deficiency, unspecified: Secondary | ICD-10-CM

## 2011-08-28 LAB — CBC WITH DIFFERENTIAL/PLATELET
Basophils Absolute: 0 10*3/uL (ref 0.0–0.1)
Eosinophils Absolute: 0 10*3/uL (ref 0.0–0.5)
HGB: 13.8 g/dL (ref 11.6–15.9)
LYMPH%: 29.1 % (ref 14.0–49.7)
MCV: 88 fL (ref 79.5–101.0)
MONO#: 0.5 10*3/uL (ref 0.1–0.9)
MONO%: 8.8 % (ref 0.0–14.0)
NEUT#: 3.2 10*3/uL (ref 1.5–6.5)
Platelets: 285 10*3/uL (ref 145–400)
RBC: 4.53 10*6/uL (ref 3.70–5.45)
RDW: 12.9 % (ref 11.2–14.5)
WBC: 5.2 10*3/uL (ref 3.9–10.3)

## 2011-08-28 NOTE — Telephone Encounter (Signed)
gve the pt her aug 2013 appt calendar along with the ct scan appt/mammo appt

## 2011-08-29 LAB — COMPREHENSIVE METABOLIC PANEL
Albumin: 4.9 g/dL (ref 3.5–5.2)
Alkaline Phosphatase: 73 U/L (ref 39–117)
BUN: 8 mg/dL (ref 6–23)
CO2: 25 mEq/L (ref 19–32)
Glucose, Bld: 104 mg/dL — ABNORMAL HIGH (ref 70–99)
Potassium: 3.5 mEq/L (ref 3.5–5.3)
Sodium: 143 mEq/L (ref 135–145)
Total Protein: 7.9 g/dL (ref 6.0–8.3)

## 2011-08-29 LAB — LACTATE DEHYDROGENASE: LDH: 145 U/L (ref 94–250)

## 2011-08-29 LAB — VITAMIN D 25 HYDROXY (VIT D DEFICIENCY, FRACTURES): Vit D, 25-Hydroxy: 61 ng/mL (ref 30–89)

## 2011-09-03 ENCOUNTER — Other Ambulatory Visit: Payer: Self-pay | Admitting: Oncology

## 2011-09-03 DIAGNOSIS — Z1231 Encounter for screening mammogram for malignant neoplasm of breast: Secondary | ICD-10-CM

## 2011-09-05 ENCOUNTER — Telehealth: Payer: Self-pay | Admitting: Oncology

## 2011-09-05 NOTE — Telephone Encounter (Signed)
lmonvm adviisng the pt of her mammo appt

## 2011-10-23 ENCOUNTER — Encounter: Payer: Self-pay | Admitting: *Deleted

## 2011-10-23 NOTE — Progress Notes (Unsigned)
RX FOR NEXIUM AND ANASTROZOLE HAVE BEEN FAXED TO [facility], PER PT REQUEST REQUEST

## 2011-10-29 NOTE — Progress Notes (Signed)
Hematology and Oncology Follow Up Visit  Jennifer Hopkins [id number] Nov 03, 1950 61 y.o. 10/29/2011 4:24 PM PCP  1. Principle Diagnosis: History of T2 N0 breast cancer, status post 4 cycles of Adriamycin/Taxotere completed 07/19/_02_________, status post lumpectomy with completion of CMF chemotherapy on 04/20/2001, on tamoxifen for 5 years, completed in October 2007.   History of basal cell carcinoma with recurrence, left axillary region, status post resection on 12/27/2009 and radiation therapy to the area completed October 2011.  Interim History:  There have been no intercurrent illness, hospitalizations or medication changes. She's been feeling well. She's had no complaints. She is traveling probably is due to go to [place] in the next month or so.  Medications: I have reviewed the patient's current medications.  Allergies: No Known Allergies  Past Medical History, Surgical history, Social history, and Family History were reviewed and updated.  Review of Systems: Constitutional:  Negative for fever, chills, night sweats, anorexia, weight loss, pain. Cardiovascular: no chest pain or dyspnea on exertion Respiratory: no cough, shortness of breath, or wheezing Neurological: no TIA or stroke symptoms Dermatological: negative ENT: negative Skin Gastrointestinal: negative Genito-Urinary: negative Hematological and Lymphatic: negative Breast: negative Musculoskeletal: negative Remaining ROS negative.  Physical Exam: Blood pressure 136/83, pulse 96, temperature 98 F (36.7 C), height 5\' 2"  (1.575 m), weight 131 lb 12.8 oz (59.784 kg). ECOG:  General appearance: alert, cooperative and appears stated age Head: Normocephalic, without obvious abnormality, atraumatic Neck: no adenopathy, no carotid bruit, no JVD, supple, symmetrical, trachea midline and thyroid not enlarged, symmetric, no tenderness/mass/nodules Lymph nodes: Cervical, supraclavicular, and axillary nodes  normal. Cardiac : regular rate and rhythm, no murmurs or gallops Pulmonary:clear to auscultation bilaterally and normal percussion bilaterally Breasts: inspection negative, no nipple discharge or bleeding, no masses or nodularity palpable, exam status post surgery the scars have healed well and there is no obvious evidence of local recurrence Abdomen:soft, non-tender; bowel sounds normal; no masses,  no organomegaly Extremities negative Neuro: alert, oriented, normal speech, no focal findings or movement disorder noted  Lab Results: Lab Results  Component Value Date   WBC 5.2 08/28/2011   HGB 13.8 08/28/2011   HCT 39.8 08/28/2011   MCV 88.0 08/28/2011   PLT 285 08/28/2011     Chemistry      Component Value Date/Time   NA 143 08/28/2011 1110   NA 141 10/17/2010 0806   K 3.5 08/28/2011 1110   K 3.5 10/17/2010 0806   CL 103 08/28/2011 1110   CL 100 10/17/2010 0806   CO2 25 08/28/2011 1110   CO2 29 10/17/2010 0806   BUN 8 08/28/2011 1110   BUN 11 10/17/2010 0806   CREATININE 0.69 08/28/2011 1110   CREATININE 0.8 10/17/2010 0806      Component Value Date/Time   CALCIUM 9.7 08/28/2011 1110   CALCIUM 9.3 10/17/2010 0806   ALKPHOS 73 08/28/2011 1110   ALKPHOS 77 10/17/2010 0806   AST 20 08/28/2011 1110   AST 24 10/17/2010 0806   ALT 10 08/28/2011 1110   BILITOT 1.1 08/28/2011 1110   BILITOT 1.40 10/17/2010 0806      .pathology. Radiological Studies: chest X-ray n/a Mammogram Due 6/13 Bone density Due   Impression and Plan: Patient is doing well. She is 61 and has had a distant history of breast cancer and more recent history of recurrent basal cell carcinoma involving the left axilla. She is clinically free of disease. I will see her in 6 months time with appropriate imaging studies.  More  than 50% of the visit was spent in patient-related counselling   Pierce Crane, MD 4/23/20134:24 PM

## 2012-01-01 ENCOUNTER — Ambulatory Visit
Admission: RE | Admit: 2012-01-01 | Discharge: 2012-01-01 | Disposition: A | Discharge status: Home / Self Care | Payer: BC Managed Care – PPO | Source: Ambulatory Visit | Attending: Oncology | Admitting: Oncology

## 2012-01-01 DIAGNOSIS — Z1231 Encounter for screening mammogram for malignant neoplasm of breast: Secondary | ICD-10-CM

## 2012-02-25 ENCOUNTER — Ambulatory Visit (HOSPITAL_COMMUNITY)
Admission: RE | Admit: 2012-02-25 | Discharge: 2012-02-25 | Disposition: A | Discharge status: Home / Self Care | Payer: BC Managed Care – PPO | Source: Ambulatory Visit | Attending: Oncology | Admitting: Oncology

## 2012-02-25 ENCOUNTER — Other Ambulatory Visit (HOSPITAL_BASED_OUTPATIENT_CLINIC_OR_DEPARTMENT_OTHER): Payer: BC Managed Care – PPO | Admitting: Lab

## 2012-02-25 DIAGNOSIS — Z9221 Personal history of antineoplastic chemotherapy: Secondary | ICD-10-CM | POA: Insufficient documentation

## 2012-02-25 DIAGNOSIS — C50919 Malignant neoplasm of unspecified site of unspecified female breast: Secondary | ICD-10-CM

## 2012-02-25 DIAGNOSIS — E559 Vitamin D deficiency, unspecified: Secondary | ICD-10-CM

## 2012-02-25 DIAGNOSIS — Z853 Personal history of malignant neoplasm of breast: Secondary | ICD-10-CM

## 2012-02-25 DIAGNOSIS — C4491 Basal cell carcinoma of skin, unspecified: Secondary | ICD-10-CM

## 2012-02-25 DIAGNOSIS — J984 Other disorders of lung: Secondary | ICD-10-CM | POA: Insufficient documentation

## 2012-02-25 DIAGNOSIS — Z923 Personal history of irradiation: Secondary | ICD-10-CM | POA: Insufficient documentation

## 2012-02-25 LAB — CBC WITH DIFFERENTIAL/PLATELET
BASO%: 0.4 % (ref 0.0–2.0)
EOS%: 0.4 % (ref 0.0–7.0)
MCH: 30.3 pg (ref 25.1–34.0)
MCHC: 33.8 g/dL (ref 31.5–36.0)
MONO#: 0.6 10*3/uL (ref 0.1–0.9)
RBC: 4.44 10*6/uL (ref 3.70–5.45)
RDW: 13 % (ref 11.2–14.5)
WBC: 6.8 10*3/uL (ref 3.9–10.3)
lymph#: 1.9 10*3/uL (ref 0.9–3.3)

## 2012-02-25 LAB — CMP (CANCER CENTER ONLY)
ALT(SGPT): 19 U/L (ref 10–47)
AST: 25 U/L (ref 11–38)
Albumin: 4.1 g/dL (ref 3.3–5.5)
Alkaline Phosphatase: 89 U/L — ABNORMAL HIGH (ref 26–84)
BUN, Bld: 9 mg/dL (ref 7–22)
Chloride: 99 mEq/L (ref 98–108)
Potassium: 3.4 mEq/L (ref 3.3–4.7)
Sodium: 143 mEq/L (ref 128–145)
Total Protein: 8 g/dL (ref 6.4–8.1)

## 2012-02-25 MED ORDER — IOHEXOL 300 MG/ML  SOLN
80.0000 mL | Freq: Once | INTRAMUSCULAR | Status: AC | PRN
  Administered 2012-02-25: 80 mL via INTRAVENOUS

## 2012-02-26 LAB — VITAMIN D 25 HYDROXY (VIT D DEFICIENCY, FRACTURES): Vit D, 25-Hydroxy: 46 ng/mL (ref 30–89)

## 2012-03-03 ENCOUNTER — Ambulatory Visit (HOSPITAL_BASED_OUTPATIENT_CLINIC_OR_DEPARTMENT_OTHER): Payer: BC Managed Care – PPO | Admitting: Oncology

## 2012-03-03 VITALS — BP 149/92 | HR 106 | Temp 98.5°F | Resp 20 | Ht 62.0 in | Wt 135.3 lb

## 2012-03-03 DIAGNOSIS — C50919 Malignant neoplasm of unspecified site of unspecified female breast: Secondary | ICD-10-CM

## 2012-03-03 DIAGNOSIS — Z853 Personal history of malignant neoplasm of breast: Secondary | ICD-10-CM

## 2012-03-03 DIAGNOSIS — Z8589 Personal history of malignant neoplasm of other organs and systems: Secondary | ICD-10-CM

## 2012-03-03 DIAGNOSIS — C4491 Basal cell carcinoma of skin, unspecified: Secondary | ICD-10-CM

## 2012-03-03 NOTE — Progress Notes (Signed)
Hematology and Oncology Follow Up Visit  Jennifer Hopkins [id number] [birth year] 61 y.o. 03/03/2012 3:55 PM   DIAGNOSIS:   No diagnosis found.   PAST THERAPY:  1. History of T2 N0 breast cancer, status post 4 cycles of Adriamycin/Taxotere completed 07/19/__________, status post lumpectomy with completion of CMF chemotherapy on 04/20/2001, on tamoxifen for 5 years, completed in October 2007.   2. History of basal cell carcinoma with recurrence, left axillary region, status post resection on 12/27/2009 and radiation therapy to the area completed October 2011.  Interim History: She is been doing well. She has no intercurrent problems. She had a recent CT scan of imaging studies. She is planning a trip to [place] next year.  Medications: I have reviewed the patient's current medications.  Allergies: No Known Allergies  Past Medical History, Surgical history, Social history, and Family History were reviewed and updated.  Review of Systems: Constitutional:  Negative for fever, chills, night sweats, anorexia, weight loss, pain. Cardiovascular: no chest pain or dyspnea on exertion Respiratory: no cough, shortness of breath, or wheezing Neurological: no TIA or stroke symptoms Dermatological: negative ENT: negative Skin Gastrointestinal: negative Genito-Urinary: negative Hematological and Lymphatic: negative Breast: negative Musculoskeletal: negative Remaining ROS negative.  Physical Exam:  Blood pressure 149/92, pulse 106, temperature 98.5 F (36.9 C), temperature source Oral, resp. rate 20, height 5\' 2"  (1.575 m), weight 135 lb 4.8 oz (61.372 kg).  ECOG: 0  HEENT:  Sclerae anicteric, conjunctivae pink.  Oropharynx clear.  No mucositis or candidiasis.  Nodes:  No cervical, supraclavicular, or axillary lymphadenopathy palpated.  Breast Exam:  Right breast is benign.  No masses, discharge, skin change, or nipple inversion.  Left breast is benign.  No masses, discharge, skin change,  or nipple inversion.[name] left axilla is negative. There is no obvious evidence of local recurrence. Lungs:  Clear to auscultation bilaterally.  No crackles, rhonchi, or wheezes.  Heart:  Regular rate and rhythm.  Abdomen:  Soft, nontender.  Positive bowel sounds.  No organomegaly or masses palpated.  Musculoskeletal:  No focal spinal tenderness to palpation.  Extremities:  Benign.  No peripheral edema or cyanosis.  Skin:  Benign.  Neuro:  Nonfocal.    Lab Results: Lab Results  Component Value Date   WBC 6.8 02/25/2012   HGB 13.4 02/25/2012   HCT 39.8 02/25/2012   MCV 89.7 02/25/2012   PLT 310 02/25/2012     Chemistry      Component Value Date/Time   NA 143 02/25/2012 1458   NA 143 08/28/2011 1110   K 3.4 02/25/2012 1458   K 3.5 08/28/2011 1110   CL 99 02/25/2012 1458   CL 103 08/28/2011 1110   CO2 29 02/25/2012 1458   CO2 25 08/28/2011 1110   BUN 9 02/25/2012 1458   BUN 8 08/28/2011 1110   CREATININE 0.7 02/25/2012 1458   CREATININE 0.69 08/28/2011 1110      Component Value Date/Time   CALCIUM 9.4 02/25/2012 1458   CALCIUM 9.7 08/28/2011 1110   ALKPHOS 89* 02/25/2012 1458   ALKPHOS 73 08/28/2011 1110   AST 25 02/25/2012 1458   AST 20 08/28/2011 1110   ALT 10 08/28/2011 1110   BILITOT 1.20 02/25/2012 1458   BILITOT 1.1 08/28/2011 1110       Radiological Studies:  Ct Chest W Contrast  02/25/2012  *RADIOLOGY REPORT*  Clinical Data:  Follow-up breast carcinoma and basal cell carcinoma.  Previous chemotherapy and radiation therapy.  CT CHEST, ABDOMEN AND PELVIS WITH CONTRAST  Technique:  Multidetector CT imaging of the chest, abdomen and pelvis was performed following the standard protocol during bolus administration of intravenous contrast.  Contrast: 80mL OMNIPAQUE IOHEXOL 300 MG/ML  SOLN  Comparison:  07/11/2011  CT CHEST  Findings:  No evidence of mediastinal or hilar masses.  No adenopathy seen elsewhere within the thorax.  No evidence of chest wall mass.  Surgical clips seen in both axillary  regions.  No evidence of pleural or pericardial effusion.  Mild scarring again seen in the right middle and left lower lobes.  No suspicious pulmonary nodules or masses are identified.  No evidence of pulmonary infiltrate or central endobronchial lesion.  No suspicious bone lesions are identified.  IMPRESSION: Stable exam.  No evidence of recurrent or metastatic carcinoma or other acute findings.  CT ABDOMEN AND PELVIS  Findings:  The abdominal parenchymal organs remain normal in appearance.  Gallbladder is unremarkable.  No evidence of hydronephrosis.  No soft tissue masses or lymphadenopathy identified within the the abdomen or pelvis.  Uterus and adnexae are unremarkable in appearance.  No evidence of inflammatory process or abnormal fluid collections. No evidence of bowel wall thickening or dilatation.  Normal appendix visualized.  No suspicious bone lesions are identified.  IMPRESSION: Stable exam.  No evidence of metastatic disease or other acute findings.   Original Report Authenticated By: Danae Orleans, M.D.    Ct Abdomen Pelvis W Contrast  02/25/2012  *RADIOLOGY REPORT*  Clinical Data:  Follow-up breast carcinoma and basal cell carcinoma.  Previous chemotherapy and radiation therapy.  CT CHEST, ABDOMEN AND PELVIS WITH CONTRAST  Technique:  Multidetector CT imaging of the chest, abdomen and pelvis was performed following the standard protocol during bolus administration of intravenous contrast.  Contrast: 80mL OMNIPAQUE IOHEXOL 300 MG/ML  SOLN  Comparison:  07/11/2011  CT CHEST  Findings:  No evidence of mediastinal or hilar masses.  No adenopathy seen elsewhere within the thorax.  No evidence of chest wall mass.  Surgical clips seen in both axillary regions.  No evidence of pleural or pericardial effusion.  Mild scarring again seen in the right middle and left lower lobes.  No suspicious pulmonary nodules or masses are identified.  No evidence of pulmonary infiltrate or central endobronchial lesion.  No  suspicious bone lesions are identified.  IMPRESSION: Stable exam.  No evidence of recurrent or metastatic carcinoma or other acute findings.  CT ABDOMEN AND PELVIS  Findings:  The abdominal parenchymal organs remain normal in appearance.  Gallbladder is unremarkable.  No evidence of hydronephrosis.  No soft tissue masses or lymphadenopathy identified within the the abdomen or pelvis.  Uterus and adnexae are unremarkable in appearance.  No evidence of inflammatory process or abnormal fluid collections. No evidence of bowel wall thickening or dilatation.  Normal appendix visualized.  No suspicious bone lesions are identified.  IMPRESSION: Stable exam.  No evidence of metastatic disease or other acute findings.   Original Report Authenticated By: Danae Orleans, M.D.      IMPRESSIONS AND PLAN: A 61 y.o. female with   History of breast cancer more recent history of basal cell carcinoma now with metastatic disease to axilla status post axillary dissection and radiation. Most recent CT does not show evidence of recurrent disease. I will plan serial followup in 6 months time with appropriate imaging studies. I reviewed her labs with her today and also encouraged take additional vitamin D.  Spent more than half the time coordinating care, as well as discussion of  BMI and its implications.      Jennifer Hopkins 8/27/20133:55 PM Cell [phone]

## 2012-03-04 ENCOUNTER — Telehealth: Payer: Self-pay | Admitting: *Deleted

## 2012-03-04 NOTE — Telephone Encounter (Signed)
Gave patient appointment for 09-15-2012 lab and scan 09-22-2012 with dr. Donnie Coffin

## 2012-08-15 ENCOUNTER — Telehealth: Payer: Self-pay | Admitting: *Deleted

## 2012-08-15 NOTE — Telephone Encounter (Signed)
Per patient reassignment I have contacted the patient. Patient is aware that Dr. Donnie Coffin is no longer with the practice, but choose Dr. Welton Flakes to follow up with her care. Patient stated that is fine, she was pressed for time. She will call back Monday to reschedule all her appts. Patient will need to reschedule lab/CT scan, patient will be in [place] at that time. JMW

## 2012-08-19 ENCOUNTER — Encounter: Payer: Self-pay | Admitting: Oncology

## 2012-08-19 ENCOUNTER — Telehealth: Payer: Self-pay | Admitting: *Deleted

## 2012-08-19 NOTE — Telephone Encounter (Signed)
Pt called about appt and I confirmed 09/29/12.  Gave pt CT's number to call them to change appt.  Mailed letter & calendar to pt.

## 2012-09-15 ENCOUNTER — Other Ambulatory Visit: Payer: BC Managed Care – PPO | Admitting: Lab

## 2012-09-15 ENCOUNTER — Ambulatory Visit (HOSPITAL_COMMUNITY): Payer: BC Managed Care – PPO

## 2012-09-22 ENCOUNTER — Ambulatory Visit: Payer: BC Managed Care – PPO | Admitting: Oncology

## 2012-09-24 ENCOUNTER — Encounter (HOSPITAL_COMMUNITY): Payer: Self-pay

## 2012-09-24 ENCOUNTER — Ambulatory Visit (HOSPITAL_COMMUNITY)
Admission: RE | Admit: 2012-09-24 | Discharge: 2012-09-24 | Disposition: A | Discharge status: Home / Self Care | Payer: BC Managed Care – PPO | Source: Ambulatory Visit | Attending: Oncology | Admitting: Oncology

## 2012-09-24 DIAGNOSIS — C50919 Malignant neoplasm of unspecified site of unspecified female breast: Secondary | ICD-10-CM

## 2012-09-24 DIAGNOSIS — C4491 Basal cell carcinoma of skin, unspecified: Secondary | ICD-10-CM

## 2012-09-24 DIAGNOSIS — J984 Other disorders of lung: Secondary | ICD-10-CM | POA: Insufficient documentation

## 2012-09-24 MED ORDER — IOHEXOL 300 MG/ML  SOLN
80.0000 mL | Freq: Once | INTRAMUSCULAR | Status: AC | PRN
  Administered 2012-09-24: 80 mL via INTRAVENOUS

## 2012-09-29 ENCOUNTER — Ambulatory Visit (HOSPITAL_BASED_OUTPATIENT_CLINIC_OR_DEPARTMENT_OTHER): Payer: BC Managed Care – PPO | Admitting: Gynecologic Oncology

## 2012-09-29 ENCOUNTER — Telehealth: Payer: Self-pay | Admitting: Oncology

## 2012-09-29 ENCOUNTER — Encounter: Payer: Self-pay | Admitting: Gynecologic Oncology

## 2012-09-29 ENCOUNTER — Ambulatory Visit (HOSPITAL_BASED_OUTPATIENT_CLINIC_OR_DEPARTMENT_OTHER): Payer: BC Managed Care – PPO | Admitting: Lab

## 2012-09-29 VITALS — BP 149/98 | HR 103 | Temp 97.0°F | Resp 20 | Ht 62.0 in | Wt 135.7 lb

## 2012-09-29 DIAGNOSIS — C50911 Malignant neoplasm of unspecified site of right female breast: Secondary | ICD-10-CM

## 2012-09-29 DIAGNOSIS — Z853 Personal history of malignant neoplasm of breast: Secondary | ICD-10-CM

## 2012-09-29 DIAGNOSIS — Z8589 Personal history of malignant neoplasm of other organs and systems: Secondary | ICD-10-CM

## 2012-09-29 LAB — COMPREHENSIVE METABOLIC PANEL (CC13)
ALT: 15 U/L (ref 0–55)
AST: 23 U/L (ref 5–34)
Albumin: 3.7 g/dL (ref 3.5–5.0)
BUN: 10.4 mg/dL (ref 7.0–26.0)
CO2: 28 mEq/L (ref 22–29)
Calcium: 9.5 mg/dL (ref 8.4–10.4)
Chloride: 105 mEq/L (ref 98–107)
Creatinine: 0.7 mg/dL (ref 0.6–1.1)
Potassium: 3.2 mEq/L — ABNORMAL LOW (ref 3.5–5.1)

## 2012-09-29 LAB — CBC WITH DIFFERENTIAL/PLATELET
BASO%: 0.9 % (ref 0.0–2.0)
Eosinophils Absolute: 0.1 10*3/uL (ref 0.0–0.5)
HCT: 38.7 % (ref 34.8–46.6)
HGB: 13.2 g/dL (ref 11.6–15.9)
LYMPH%: 23.4 % (ref 14.0–49.7)
MCHC: 34 g/dL (ref 31.5–36.0)
MONO#: 0.4 10*3/uL (ref 0.1–0.9)
NEUT#: 3.8 10*3/uL (ref 1.5–6.5)
NEUT%: 66.1 % (ref 38.4–76.8)
Platelets: 300 10*3/uL (ref 145–400)
WBC: 5.8 10*3/uL (ref 3.9–10.3)
lymph#: 1.3 10*3/uL (ref 0.9–3.3)

## 2012-09-29 NOTE — Patient Instructions (Addendum)
Doing well.  We will contact you with the results of your lab work from today.  Plan to follow up with Dr. Welton Flakes in 6 months with lab work at that time or sooner if needed.  Please find a primary care physician and follow up with your gynecologist.  Please call for any questions or concerns.  Thank you for coming to see me today.  I appreciate your confidence in choosing [facility] for your medical care.  If you have any questions about your visit today, please call our office and we will get back to you as soon as possible.  Warner Mccreedy, NP Medical Oncology

## 2012-09-29 NOTE — Progress Notes (Signed)
OFFICE PROGRESS NOTE  CC  Jennifer Paris, MD [address] [place] [address]  DIAGNOSIS:  Jennifer Hopkins is a 62 year old [place] woman, who had a mammogram on 10/16/2000 that showed a mass in the right breast.  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    PRIOR THERAPY:  On 11/10/2000, she underwent right sentinel lymph node biopsy with 4 of 4 nodes resulting negative.  She then completed four cycles of Adriamycin and Taxotere.  On 02/06/2001, she underwent a lumpectomy with final pathology resulting residual IMC and DCIS measuring 1.5 cm.  She completed CMF chemo on 04/20/2001 and also underwent radiation therapy.  She then began taking Tamoxifen, which she took for five years with therapy completion in 04/2006.  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  The patient underwent Mohs surgery at [facility] in 2001 and then received postoperative radiation from Dr. Roselind Messier.  As part of her follow up and surveillance, she underwent routine screening digital mammography on December 22, 2009, which showed a possible mass within the left breast region.    Additional views confirmed the presence of a spiculated mass within the left axillary region, which was radiodense.  On ultrasound this measured 1.7 x 1.7 x 1.6 cm.  She then proceeded to undergo ultrasound and core biopsy of the suspicious axillary node.  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  Staging workup consisting of a CT scan of the chest, abdomen, and pelvis was then performed on 01/09/2010.  On patient's chest CT scan, she was noted to have left axillary soft tissue mass, which measured approximately 2.2 x 2.8 cm.  There are no other areas of recurrence noted.  In addition, on July 5, the patient underwent a PET scan, which showed  hypermetabolic left axillary mass with maximum SUV of 9.8.  On PET scan, the lesion measured approximately 1.9 cm.  There were no other lesions noted on the patient's PET scan.  On February 13, 2010, she underwent left axilla resection by Dr. Jamey Ripa with final pathology revealing perineural invasion with 7 nodes negative.  From 03/28/10 to 05/07/10, she underwent radiation therapy under the care of Dr. Roselind Messier.    CURRENT THERAPY:  Close surveillance.  INTERVAL HISTORY: Jennifer Hopkins 62 y.o. female returns for continued follow up.  She reports recovering from a recent cold.  She has been doing well.  No complaints since her last visit.  She denies having a primary care provider and states that she last saw her gynecologist with a pap in 2003 or 2004.  She reports having a recent CT of the chest, which Dr. 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  She recently went to [place] in March and plans to go to [place] to site see in the near future.     MEDICAL HISTORY: Past Medical History  Diagnosis Date  . Cancer     ALLERGIES:  has No Known Allergies.  MEDICATIONS:  Current Outpatient Prescriptions  Medication Sig Dispense Refill  . calcium-vitamin D (OSCAL WITH D) 250-125 MG-UNIT per tablet Take 1 tablet by mouth daily.        . cholecalciferol (VITAMIN D) 1000 UNITS tablet Take 1,000 Units by mouth daily. Pt.  Not sure of dosage       . diphenhydrAMINE (SOMINEX) 25 MG tablet Take 25 mg by mouth at bedtime as needed.      . Multiple Vitamin (MULTIVITAMIN) tablet Take 1 tablet by mouth daily.        . pseudoephedrine (SUDAFED) 30 MG tablet Take 30 mg by mouth every 4 (four) hours as needed.         No current facility-administered medications for this visit.    SURGICAL HISTORY: No past surgical history on file.  REVIEW OF SYSTEMS:   Constitutional: Feels well.  Cardiovascular: No chest pain, shortness of breath, or edema.   Pulmonary: No cough or wheeze.  Gastrointestinal: No nausea, vomiting, or diarrhea. No bright red blood per rectum or change in bowel movement.  Genitourinary: No frequency, urgency, or dysuria. No vaginal bleeding or discharge.  Musculoskeletal: No myalgia or joint pain. Neurologic: No weakness, numbness, or change in gait.  Psychology: No depression, anxiety, or insomnia.  HEALTH MAINTENANCE: Mammogram: 01/01/12 Colonoscopy: 2 to 3 years ago per pt Pap Smear: 2003 or 2004 per pt Vitamin D: 02/25/2012 resulting 46 Lipid Panel: None  PHYSICAL EXAMINATION: There were no vitals taken for this visit. There is no weight on file to calculate BMI. ECOG PERFORMANCE STATUS: 0 - Asymptomatic  General: Well developed, well nourished female in no acute distress. Alert and oriented x 3.  Head/ Neck: Oropharynx clear.  Sclerae anicteric.  Supple without any enlargements.  Lymph node survey: No cervical, supraclavicular, or axillary adenopathy  Cardiovascular: Regular rate and rhythm. S1 and S2 normal.  Lungs: Clear to auscultation bilaterally. No wheezes/crackles/rhonchi noted.  Skin: No rashes or lesions present. Back: No CVA tenderness.  Abdomen: Abdomen soft, non-tender and non-obese. Active bowel sounds in all quadrants. No evidence of a fluid wave or abdominal masses.  Breasts: Inspection negative with no nodularity, masses, erythema, or discharge noted bilaterally.  Right and left axilla negative for signs of recurrence. Extremities: No bilateral cyanosis, edema, or clubbing.    LABORATORY DATA: Lab Results  Component Value Date   WBC 6.8 02/25/2012   HGB 13.4 02/25/2012   HCT 39.8 02/25/2012   MCV 89.7 02/25/2012   PLT 310 02/25/2012      Chemistry      Component Value Date/Time   NA 143 02/25/2012 1458   NA 143 08/28/2011 1110   K 3.4 02/25/2012 1458   K 3.5 08/28/2011 1110   CL 99 02/25/2012 1458   CL 103 08/28/2011 1110   CO2 29 02/25/2012 1458   CO2 25 08/28/2011 1110   BUN 9  02/25/2012 1458   BUN 8 08/28/2011 1110   CREATININE 0.7 02/25/2012 1458   CREATININE 0.69 08/28/2011 1110      Component Value Date/Time   CALCIUM 9.4 02/25/2012 1458   CALCIUM 9.7 08/28/2011 1110   ALKPHOS 89* 02/25/2012 1458   ALKPHOS 73 08/28/2011 1110   AST 25 02/25/2012 1458   AST 20 08/28/2011 1110   ALT 10 08/28/2011 1110   BILITOT 1.20 02/25/2012 1458   BILITOT 1.1 08/28/2011 1110      RADIOGRAPHIC STUDIES:  Ct Chest W Contrast  09/24/2012  *RADIOLOGY REPORT*  Clinical Data: Right breast cancer status post lumpectomy in 2002, status post XRT and chemotherapy  CT CHEST WITH CONTRAST  Technique:  Multidetector CT imaging of the chest was performed following the standard protocol during bolus administration of intravenous contrast.  Contrast: 80mL OMNIPAQUE IOHEXOL 300 MG/ML  SOLN  Comparison: 02/25/2012  Findings: Scattered 2-3 mm nodules in the right lower lobe (series 5/image 25) and left lower lobe (series 5/images 36 and 39), unchanged since 2011, benign.  Linear scarring in the right middle lobe and left lower lobe.  No pleural effusion or pneumothorax.  The visualized thyroid is unremarkable.  The heart is normal in size.  No pericardial effusion.  Small mediastinal lymph nodes which do not meet pathologic CT size criteria.  No suspicious hilar or axillary lymphadenopathy.  Surgical clips in the right breast and bilateral axilla.  The visualized upper abdomen is unremarkable.  Moderate to thoracic dextroscoliosis.  No focal osseous lesions.  IMPRESSION: Postsurgical changes in the right breast and bilateral axilla.  No evidence of recurrent or metastatic disease in the chest.   Original Report Authenticated By: Charline Bills, M.D.     ASSESSMENT:  62 year old [place] woman: #1  Right breast biopsy on 10/16/00 resulting IDC plus DCIS, 1.9 cm, ER 90%, PR 81%, Ki67 6%, HER2-.  #2  Right sentinel lymph node biopsy on 11/10/00 with 4 nodes negative.    #3  Neoadjuvant chemotherapy with  completion of 4 cycles of Adriamycin and Taxotere in July of 2002.  #4  S/P right lumpectomy by Dr. Jamey Ripa in August 2002 for a T1c N0, Stage 1 right breast IDC plus DCIS, ER+, PR+, Ki67 6%, HER2-.  #5  She completed radiation therapy following her lumpectomy along with CMF chemotherapy with completion on 04/20/01.  #6  Tamoxifen for 5 years with completion in October of 2007.  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 Jamey Ripa on 02/13/10.  #8  She completed radiation to the left axilla by Dr. Roselind Messier from 03/28/10 to 05/07/10.  PLAN:  She is to have lab work today and we will contact her with the results.  She is to follow up with Dr. Welton Flakes in 6 months or sooner if needed with lab work at that time.  She is strongly advised to find a primary care physician and follow up with her gynecologist.      All questions were answered. The patient knows to call the clinic with any problems, questions or concerns. We can certainly see the patient much sooner if necessary.  I spent 30 minutes counseling the patient face to face and Dr. Welton Flakes spent 10 minutes speaking with the patient about future plans and recommendations. The total time spent in the appointment was 60 minutes.   Warner Mccreedy, NP Medical/Oncology [facility] [phone] (Office)  09/29/2012, 9:02 AM

## 2012-10-05 ENCOUNTER — Other Ambulatory Visit: Payer: Self-pay | Admitting: Gynecologic Oncology

## 2012-10-05 DIAGNOSIS — E876 Hypokalemia: Secondary | ICD-10-CM

## 2012-10-05 MED ORDER — POTASSIUM CHLORIDE CRYS ER 20 MEQ PO TBCR: 20.0000 meq | EXTENDED_RELEASE_TABLET | Freq: Every day | ORAL | Status: DC

## 2012-10-05 NOTE — Progress Notes (Signed)
Patient informed of lab results and Dr. Milta Deiters recommendation for K-dur 20 meq daily for 5 days.  No concerns voiced.  Instructed to call for any needs.

## 2012-12-15 ENCOUNTER — Telehealth: Payer: Self-pay | Admitting: Oncology

## 2012-12-15 NOTE — Telephone Encounter (Signed)
, °

## 2013-01-01 ENCOUNTER — Ambulatory Visit
Admission: RE | Admit: 2013-01-01 | Discharge: 2013-01-01 | Disposition: A | Discharge status: Home / Self Care | Payer: BC Managed Care – PPO | Source: Ambulatory Visit | Attending: Gynecologic Oncology | Admitting: Gynecologic Oncology

## 2013-01-01 DIAGNOSIS — C50911 Malignant neoplasm of unspecified site of right female breast: Secondary | ICD-10-CM

## 2013-03-17 ENCOUNTER — Other Ambulatory Visit (HOSPITAL_BASED_OUTPATIENT_CLINIC_OR_DEPARTMENT_OTHER): Payer: BC Managed Care – PPO

## 2013-03-17 DIAGNOSIS — C50911 Malignant neoplasm of unspecified site of right female breast: Secondary | ICD-10-CM

## 2013-03-17 DIAGNOSIS — Z853 Personal history of malignant neoplasm of breast: Secondary | ICD-10-CM

## 2013-03-17 LAB — CBC WITH DIFFERENTIAL/PLATELET
Basophils Absolute: 0 10*3/uL (ref 0.0–0.1)
EOS%: 2.3 % (ref 0.0–7.0)
Eosinophils Absolute: 0.1 10*3/uL (ref 0.0–0.5)
HCT: 38.8 % (ref 34.8–46.6)
HGB: 13.4 g/dL (ref 11.6–15.9)
MCH: 30.2 pg (ref 25.1–34.0)
NEUT#: 3.7 10*3/uL (ref 1.5–6.5)
NEUT%: 60 % (ref 38.4–76.8)
lymph#: 1.7 10*3/uL (ref 0.9–3.3)

## 2013-03-17 LAB — COMPREHENSIVE METABOLIC PANEL (CC13)
AST: 18 U/L (ref 5–34)
Albumin: 3.8 g/dL (ref 3.5–5.0)
BUN: 8.1 mg/dL (ref 7.0–26.0)
CO2: 27 mEq/L (ref 22–29)
Calcium: 9.9 mg/dL (ref 8.4–10.4)
Chloride: 107 mEq/L (ref 98–109)
Creatinine: 0.8 mg/dL (ref 0.6–1.1)
Glucose: 99 mg/dl (ref 70–140)

## 2013-03-24 ENCOUNTER — Ambulatory Visit: Payer: BC Managed Care – PPO | Admitting: Oncology

## 2013-03-25 ENCOUNTER — Telehealth: Payer: Self-pay | Admitting: *Deleted

## 2013-03-25 ENCOUNTER — Ambulatory Visit (HOSPITAL_BASED_OUTPATIENT_CLINIC_OR_DEPARTMENT_OTHER): Payer: BC Managed Care – PPO | Admitting: Oncology

## 2013-03-25 ENCOUNTER — Encounter: Payer: Self-pay | Admitting: Oncology

## 2013-03-25 VITALS — BP 162/88 | HR 87 | Temp 97.5°F | Resp 20 | Ht 62.0 in | Wt 142.0 lb

## 2013-03-25 DIAGNOSIS — C44310 Basal cell carcinoma of skin of unspecified parts of face: Secondary | ICD-10-CM

## 2013-03-25 DIAGNOSIS — C44319 Basal cell carcinoma of skin of other parts of face: Secondary | ICD-10-CM

## 2013-03-25 NOTE — Telephone Encounter (Signed)
appt made and printed. gv pt appt for Dr.Lupton office....td

## 2013-03-25 NOTE — Patient Instructions (Addendum)
Refer to Dermatology  We will see you back in June 2015

## 2013-03-28 NOTE — Progress Notes (Signed)
OFFICE PROGRESS NOTE  CC  Currie Paris, MD [address] [place] [address]  DIAGNOSIS:  Jennifer Hopkins is a 62 year old [place] woman, who had a mammogram on 10/16/2000 that showed a mass in the right breast.  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    PRIOR THERAPY:  On 11/10/2000, she underwent right sentinel lymph node biopsy with 4 of 4 nodes resulting negative.  She then completed four cycles of Adriamycin and Taxotere.  On 02/06/2001, she underwent a lumpectomy with final pathology resulting residual IMC and DCIS measuring 1.5 cm.  She completed CMF chemo on 04/20/2001 and also underwent radiation therapy.  She then began taking Tamoxifen, which she took for five years with therapy completion in 04/2006.  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  The patient underwent Mohs surgery at [facility] in 2001 and then received postoperative radiation from Dr. Roselind Messier.  As part of her follow up and surveillance, she underwent routine screening digital mammography on December 22, 2009, which showed a possible mass within the left breast region.    Additional views confirmed the presence of a spiculated mass within the left axillary region, which was radiodense.  On ultrasound this measured 1.7 x 1.7 x 1.6 cm.  She then proceeded to undergo ultrasound and core biopsy of the suspicious axillary node.  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  Staging workup consisting of a CT scan of the chest, abdomen, and pelvis was then performed on 01/09/2010.  On patient's chest CT scan, she was noted to have left axillary soft tissue mass, which measured approximately 2.2 x 2.8 cm.  There are no other areas of recurrence noted.  In addition, on July 5, the patient underwent a PET scan, which showed  hypermetabolic left axillary mass with maximum SUV of 9.8.  On PET scan, the lesion measured approximately 1.9 cm.  There were no other lesions noted on the patient's PET scan.  On February 13, 2010, she underwent left axilla resection by Dr. Jamey Ripa with final pathology revealing perineural invasion with 7 nodes negative.  From 03/28/10 to 05/07/10, she underwent radiation therapy under the care of Dr. Roselind Messier.    CURRENT THERAPY:  Close surveillance.  INTERVAL HISTORY: Jennifer Hopkins 62 y.o. female returns for continued follow up.    She has been doing well.  No complaints since her last visit.  S  She recently went to [place] in March and plans to go to [place] to site see in the near future.   Remainder of the template review of systems is negative  MEDICAL HISTORY: Past Medical History  Diagnosis Date  . Cancer     Breast    ALLERGIES:  has No Known Allergies.  MEDICATIONS:  Current Outpatient Prescriptions  Medication Sig Dispense Refill  . calcium-vitamin D (OSCAL WITH D) 250-125 MG-UNIT per tablet Take 1 tablet by mouth daily.        . cholecalciferol (VITAMIN D) 1000 UNITS tablet Take 1,000 Units by mouth daily. Pt.  Not sure of dosage       . diphenhydrAMINE (SOMINEX) 25 MG tablet Take 25 mg by mouth at bedtime as needed.      . Multiple Vitamin (MULTIVITAMIN) tablet Take 1 tablet by mouth daily.        [name]  pseudoephedrine (SUDAFED) 30 MG tablet Take 30 mg by mouth every 4 (four) hours as needed.         No current facility-administered medications for this visit.    SURGICAL HISTORY:  Past Surgical History  Procedure Laterality Date  . Breast lumpectomy  02/2001  . Mohs surgery  2001    at [facility]  . Basal cell carcinoma excision  12/2009    REVIEW OF SYSTEMS:   Constitutional: Feels well.  Cardiovascular: No chest pain, shortness of breath, or edema.  Pulmonary: No cough or wheeze.  Gastrointestinal: No nausea, vomiting, or diarrhea. No bright red blood per rectum or  change in bowel movement.  Genitourinary: No frequency, urgency, or dysuria. No vaginal bleeding or discharge.  Musculoskeletal: No myalgia or joint pain. Neurologic: No weakness, numbness, or change in gait.  Psychology: No depression, anxiety, or insomnia.  HEALTH MAINTENANCE: Mammogram: 01/01/12 Colonoscopy: 2 to 3 years ago per pt Pap Smear: 2003 or 2004 per pt Vitamin D: 02/25/2012 resulting 46 Lipid Panel: None  PHYSICAL EXAMINATION: Blood pressure 162/88, pulse 87, temperature 97.5 F (36.4 C), temperature source Oral, resp. rate 20, height 5\' 2"  (1.575 m), weight 142 lb (64.411 kg). Body mass index is 25.97 kg/(m^2). ECOG PERFORMANCE STATUS: 0 - Asymptomatic  General: Well developed, well nourished female in no acute distress. Alert and oriented x 3.  Head/ Neck: Oropharynx clear.  Sclerae anicteric.  Supple without any enlargements.  Lymph node survey: No cervical, supraclavicular, or axillary adenopathy  Cardiovascular: Regular rate and rhythm. S1 and S2 normal.  Lungs: Clear to auscultation bilaterally. No wheezes/crackles/rhonchi noted.  Skin: No rashes or lesions present. Back: No CVA tenderness.  Abdomen: Abdomen soft, non-tender and non-obese. Active bowel sounds in all quadrants. No evidence of a fluid wave or abdominal masses.  Breasts: Inspection negative with no nodularity, masses, erythema, or discharge noted bilaterally.  Right and left axilla negative for signs of recurrence. Extremities: No bilateral cyanosis, edema, or clubbing.    LABORATORY DATA: Lab Results  Component Value Date   WBC 6.2 03/17/2013   HGB 13.4 03/17/2013   HCT 38.8 03/17/2013   MCV 87.8 03/17/2013   PLT 295 03/17/2013      Chemistry      Component Value Date/Time   NA 144 03/17/2013 0829   NA 143 02/25/2012 1458   NA 143 08/28/2011 1110   K 3.7 03/17/2013 0829   K 3.4 02/25/2012 1458   K 3.5 08/28/2011 1110   CL 105 09/29/2012 1000   CL 99 02/25/2012 1458   CL 103 08/28/2011 1110   CO2  27 03/17/2013 0829   CO2 29 02/25/2012 1458   CO2 25 08/28/2011 1110   BUN 8.1 03/17/2013 0829   BUN 9 02/25/2012 1458   BUN 8 08/28/2011 1110   CREATININE 0.8 03/17/2013 0829   CREATININE 0.7 02/25/2012 1458   CREATININE 0.69 08/28/2011 1110      Component Value Date/Time   CALCIUM 9.9 03/17/2013 0829   CALCIUM 9.4 02/25/2012 1458   CALCIUM 9.7 08/28/2011 1110   ALKPHOS 81 03/17/2013 0829   ALKPHOS 89* 02/25/2012 1458   ALKPHOS 73 08/28/2011 1110   AST 18 03/17/2013 0829   AST 25 02/25/2012 1458   AST 20 08/28/2011 1110   ALT 11 03/17/2013 0829   ALT 19 02/25/2012 1458   ALT 10 08/28/2011 1110   BILITOT 0.96 03/17/2013 0829   BILITOT 1.20 02/25/2012 1458   BILITOT 1.1 08/28/2011 1110  RADIOGRAPHIC STUDIES:  Ct Chest W Contrast  09/24/2012  *RADIOLOGY REPORT*  Clinical Data: Right breast cancer status post lumpectomy in 2002, status post XRT and chemotherapy  CT CHEST WITH CONTRAST  Technique:  Multidetector CT imaging of the chest was performed following the standard protocol during bolus administration of intravenous contrast.  Contrast: 80mL OMNIPAQUE IOHEXOL 300 MG/ML  SOLN  Comparison: 02/25/2012  Findings: Scattered 2-3 mm nodules in the right lower lobe (series 5/image 25) and left lower lobe (series 5/images 36 and 39), unchanged since 2011, benign.  Linear scarring in the right middle lobe and left lower lobe.  No pleural effusion or pneumothorax.  The visualized thyroid is unremarkable.  The heart is normal in size.  No pericardial effusion.  Small mediastinal lymph nodes which do not meet pathologic CT size criteria.  No suspicious hilar or axillary lymphadenopathy.  Surgical clips in the right breast and bilateral axilla.  The visualized upper abdomen is unremarkable.  Moderate to thoracic dextroscoliosis.  No focal osseous lesions.  IMPRESSION: Postsurgical changes in the right breast and bilateral axilla.  No evidence of recurrent or metastatic disease in the chest.   Original Report  Authenticated By: Charline Bills, M.D.     ASSESSMENT:  62 year old [place] woman: #1  Right breast biopsy on 10/16/00 resulting IDC plus DCIS, 1.9 cm, ER 90%, PR 81%, Ki67 6%, HER2-.  #2  Right sentinel lymph node biopsy on 11/10/00 with 4 nodes negative.    #3  Neoadjuvant chemotherapy with completion of 4 cycles of Adriamycin and Taxotere in July of 2002.  #4  S/P right lumpectomy by Dr. Jamey Ripa in August 2002 for a T1c N0, Stage 1 right breast IDC plus DCIS, ER+, PR+, Ki67 6%, HER2-.  #5  She completed radiation therapy following her lumpectomy along with CMF chemotherapy with completion on 04/20/01.  #6  Tamoxifen for 5 years with completion in October of 2007.  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 Jamey Ripa on 02/13/10.  #8  She completed radiation to the left axilla by Dr. Roselind Messier from 03/28/10 to 05/07/10.  PLAN:  She is to have lab work today and we will contact her with the results.  Patient will be seen back in 6 months time for followup.  She is strongly advised to find a primary care physician and follow up with her gynecologist.      All questions were answered. The patient knows to call the clinic with any problems, questions or concerns. We can certainly see the patient much sooner if necessary.   Drue Second, MD Medical/Oncology [facility] [phone] (beeper) [phone] (Office)

## 2013-08-17 ENCOUNTER — Emergency Department (HOSPITAL_COMMUNITY): Payer: BC Managed Care – PPO

## 2013-08-17 ENCOUNTER — Inpatient Hospital Stay (HOSPITAL_COMMUNITY)
Admission: EM | Admit: 2013-08-17 | Discharge: 2013-08-18 | DRG: 502 | Disposition: A | Discharge status: Home / Self Care | Payer: BC Managed Care – PPO | Attending: Orthopedic Surgery | Admitting: Orthopedic Surgery

## 2013-08-17 ENCOUNTER — Encounter (HOSPITAL_COMMUNITY): Payer: Self-pay | Admitting: Emergency Medicine

## 2013-08-17 ENCOUNTER — Encounter (HOSPITAL_COMMUNITY): Payer: BC Managed Care – PPO | Admitting: Certified Registered Nurse Anesthetist

## 2013-08-17 ENCOUNTER — Encounter (HOSPITAL_COMMUNITY)
Admission: EM | Disposition: A | Discharge status: Home / Self Care | Payer: Self-pay | Source: Home / Self Care | Attending: Orthopedic Surgery

## 2013-08-17 ENCOUNTER — Emergency Department (HOSPITAL_COMMUNITY): Payer: BC Managed Care – PPO | Admitting: Certified Registered Nurse Anesthetist

## 2013-08-17 DIAGNOSIS — Z853 Personal history of malignant neoplasm of breast: Secondary | ICD-10-CM

## 2013-08-17 DIAGNOSIS — Z85828 Personal history of other malignant neoplasm of skin: Secondary | ICD-10-CM

## 2013-08-17 DIAGNOSIS — S52509A Unspecified fracture of the lower end of unspecified radius, initial encounter for closed fracture: Principal | ICD-10-CM | POA: Diagnosis present

## 2013-08-17 DIAGNOSIS — W010XXA Fall on same level from slipping, tripping and stumbling without subsequent striking against object, initial encounter: Secondary | ICD-10-CM | POA: Diagnosis present

## 2013-08-17 DIAGNOSIS — Y9229 Other specified public building as the place of occurrence of the external cause: Secondary | ICD-10-CM

## 2013-08-17 DIAGNOSIS — S52502A Unspecified fracture of the lower end of left radius, initial encounter for closed fracture: Secondary | ICD-10-CM

## 2013-08-17 DIAGNOSIS — S52609A Unspecified fracture of lower end of unspecified ulna, initial encounter for closed fracture: Principal | ICD-10-CM | POA: Diagnosis present

## 2013-08-17 HISTORY — PX: OPEN REDUCTION INTERNAL FIXATION (ORIF) DISTAL RADIAL FRACTURE: SHX5989

## 2013-08-17 LAB — CBC WITH DIFFERENTIAL/PLATELET
BASOS ABS: 0 10*3/uL (ref 0.0–0.1)
Basophils Relative: 0 % (ref 0–1)
EOS PCT: 0 % (ref 0–5)
Eosinophils Absolute: 0 10*3/uL (ref 0.0–0.7)
HCT: 37.8 % (ref 36.0–46.0)
Hemoglobin: 12.9 g/dL (ref 12.0–15.0)
Lymphocytes Relative: 15 % (ref 12–46)
Lymphs Abs: 1.6 10*3/uL (ref 0.7–4.0)
MCH: 30.1 pg (ref 26.0–34.0)
MCHC: 34.1 g/dL (ref 30.0–36.0)
MCV: 88.3 fL (ref 78.0–100.0)
Monocytes Absolute: 0.7 10*3/uL (ref 0.1–1.0)
Monocytes Relative: 7 % (ref 3–12)
Neutro Abs: 7.9 10*3/uL — ABNORMAL HIGH (ref 1.7–7.7)
Neutrophils Relative %: 78 % — ABNORMAL HIGH (ref 43–77)
PLATELETS: 270 10*3/uL (ref 150–400)
RBC: 4.28 MIL/uL (ref 3.87–5.11)
RDW: 12.8 % (ref 11.5–15.5)
WBC: 10.1 10*3/uL (ref 4.0–10.5)

## 2013-08-17 LAB — BASIC METABOLIC PANEL
BUN: 8 mg/dL (ref 6–23)
CO2: 24 mEq/L (ref 19–32)
CREATININE: 0.68 mg/dL (ref 0.50–1.10)
Calcium: 9.3 mg/dL (ref 8.4–10.5)
Chloride: 99 mEq/L (ref 96–112)
GFR calc Af Amer: >90 mL/min (ref >90)
GFR calc non Af Amer: >90 mL/min (ref >90)
Glucose, Bld: 138 mg/dL — ABNORMAL HIGH (ref 70–99)
Potassium: 3.4 mEq/L — ABNORMAL LOW (ref 3.7–5.3)
SODIUM: 138 meq/L (ref 137–147)

## 2013-08-17 SURGERY — OPEN REDUCTION INTERNAL FIXATION (ORIF) DISTAL RADIUS FRACTURE
Anesthesia: General | Site: Arm Lower | Laterality: Left

## 2013-08-17 MED ORDER — OXYCODONE-ACETAMINOPHEN 5-325 MG PO TABS
1.0000 | ORAL_TABLET | Freq: Once | ORAL | Status: AC
  Administered 2013-08-17: 1 via ORAL
  Filled 2013-08-17: qty 1

## 2013-08-17 MED ORDER — ONDANSETRON HCL 4 MG/2ML IJ SOLN
4.0000 mg | Freq: Once | INTRAMUSCULAR | Status: AC
  Administered 2013-08-17: 4 mg via INTRAVENOUS
  Filled 2013-08-17: qty 2

## 2013-08-17 MED ORDER — CEFAZOLIN SODIUM 1-5 GM-% IV SOLN: 1.0000 g | INTRAVENOUS | Status: DC

## 2013-08-17 MED ORDER — CEFAZOLIN SODIUM-DEXTROSE 2-3 GM-% IV SOLR
INTRAVENOUS | Status: AC
  Administered 2013-08-17: 2 g via INTRAVENOUS
  Filled 2013-08-17: qty 50

## 2013-08-17 MED ORDER — METHOCARBAMOL 100 MG/ML IJ SOLN
500.0000 mg | Freq: Four times a day (QID) | INTRAMUSCULAR | Status: DC | PRN
  Filled 2013-08-17: qty 5

## 2013-08-17 MED ORDER — BUPIVACAINE HCL (PF) 0.25 % IJ SOLN
INTRAMUSCULAR | Status: DC | PRN
  Administered 2013-08-17: 10 mL

## 2013-08-17 MED ORDER — ONDANSETRON HCL 4 MG/2ML IJ SOLN: 4.0000 mg | Freq: Once | INTRAMUSCULAR | Status: DC | PRN

## 2013-08-17 MED ORDER — SENNA 8.6 MG PO TABS
1.0000 | ORAL_TABLET | Freq: Two times a day (BID) | ORAL | Status: DC
  Administered 2013-08-17 – 2013-08-18 (×2): 8.6 mg via ORAL
  Filled 2013-08-17 (×3): qty 1

## 2013-08-17 MED ORDER — HYDROMORPHONE HCL PF 1 MG/ML IJ SOLN: 0.2500 mg | INTRAMUSCULAR | Status: DC | PRN

## 2013-08-17 MED ORDER — 0.9 % SODIUM CHLORIDE (POUR BTL) OPTIME
TOPICAL | Status: DC | PRN
  Administered 2013-08-17: 1000 mL

## 2013-08-17 MED ORDER — LACTATED RINGERS IV SOLN: INTRAVENOUS | Status: DC

## 2013-08-17 MED ORDER — BUPIVACAINE HCL (PF) 0.25 % IJ SOLN
INTRAMUSCULAR | Status: AC
  Filled 2013-08-17: qty 30

## 2013-08-17 MED ORDER — ONDANSETRON HCL 4 MG/2ML IJ SOLN
INTRAMUSCULAR | Status: AC
  Filled 2013-08-17: qty 2

## 2013-08-17 MED ORDER — HYDROMORPHONE HCL PF 1 MG/ML IJ SOLN
INTRAMUSCULAR | Status: AC
  Administered 2013-08-17: 21:00:00
  Filled 2013-08-17: qty 1

## 2013-08-17 MED ORDER — CEFAZOLIN SODIUM 1-5 GM-% IV SOLN
1.0000 g | Freq: Three times a day (TID) | INTRAVENOUS | Status: DC
  Administered 2013-08-17 – 2013-08-18 (×2): 1 g via INTRAVENOUS
  Filled 2013-08-17 (×5): qty 50

## 2013-08-17 MED ORDER — MORPHINE SULFATE 2 MG/ML IJ SOLN: 1.0000 mg | INTRAMUSCULAR | Status: DC | PRN

## 2013-08-17 MED ORDER — ROCURONIUM BROMIDE 50 MG/5ML IV SOLN
INTRAVENOUS | Status: AC
  Filled 2013-08-17: qty 1

## 2013-08-17 MED ORDER — PROPOFOL 10 MG/ML IV BOLUS
INTRAVENOUS | Status: DC | PRN
  Administered 2013-08-17: 160 mg via INTRAVENOUS

## 2013-08-17 MED ORDER — ADULT MULTIVITAMIN W/MINERALS CH
1.0000 | ORAL_TABLET | Freq: Every day | ORAL | Status: DC
  Administered 2013-08-17 – 2013-08-18 (×2): 1 via ORAL
  Filled 2013-08-17 (×2): qty 1

## 2013-08-17 MED ORDER — ONDANSETRON HCL 4 MG/2ML IJ SOLN
INTRAMUSCULAR | Status: DC | PRN
  Administered 2013-08-17: 4 mg via INTRAVENOUS

## 2013-08-17 MED ORDER — FAMOTIDINE 20 MG PO TABS
20.0000 mg | ORAL_TABLET | Freq: Two times a day (BID) | ORAL | Status: DC | PRN
  Filled 2013-08-17: qty 1

## 2013-08-17 MED ORDER — VITAMIN C 500 MG PO TABS
1000.0000 mg | ORAL_TABLET | Freq: Every day | ORAL | Status: DC
  Administered 2013-08-17 – 2013-08-18 (×2): 1000 mg via ORAL
  Filled 2013-08-17 (×2): qty 2

## 2013-08-17 MED ORDER — HYDROMORPHONE HCL PF 1 MG/ML IJ SOLN
1.0000 mg | Freq: Once | INTRAMUSCULAR | Status: AC
  Administered 2013-08-17: 1 mg via INTRAVENOUS
  Filled 2013-08-17: qty 1

## 2013-08-17 MED ORDER — ALPRAZOLAM 0.5 MG PO TABS: 0.5000 mg | ORAL_TABLET | Freq: Four times a day (QID) | ORAL | Status: DC | PRN

## 2013-08-17 MED ORDER — FENTANYL CITRATE 0.05 MG/ML IJ SOLN
INTRAMUSCULAR | Status: DC | PRN
  Administered 2013-08-17: 75 ug via INTRAVENOUS

## 2013-08-17 MED ORDER — PHENYLEPHRINE HCL 10 MG/ML IJ SOLN
INTRAMUSCULAR | Status: DC | PRN
  Administered 2013-08-17 (×5): 80 ug via INTRAVENOUS

## 2013-08-17 MED ORDER — HYDROMORPHONE HCL PF 1 MG/ML IJ SOLN
0.2500 mg | INTRAMUSCULAR | Status: DC | PRN
  Administered 2013-08-17: 0.25 mg via INTRAVENOUS

## 2013-08-17 MED ORDER — EPHEDRINE SULFATE 50 MG/ML IJ SOLN
INTRAMUSCULAR | Status: DC | PRN
  Administered 2013-08-17: 5 mg via INTRAVENOUS

## 2013-08-17 MED ORDER — OXYCODONE HCL 5 MG PO TABS
5.0000 mg | ORAL_TABLET | ORAL | Status: DC | PRN
Start: 2013-08-17 — End: 2013-08-18
  Administered 2013-08-17 – 2013-08-18 (×4): 5 mg via ORAL
  Filled 2013-08-17 (×4): qty 1

## 2013-08-17 MED ORDER — LACTATED RINGERS IV SOLN
INTRAVENOUS | Status: DC | PRN
  Administered 2013-08-17 (×2): via INTRAVENOUS

## 2013-08-17 MED ORDER — METHOCARBAMOL 500 MG PO TABS
500.0000 mg | ORAL_TABLET | Freq: Four times a day (QID) | ORAL | Status: DC | PRN
  Administered 2013-08-17 – 2013-08-18 (×2): 500 mg via ORAL
  Filled 2013-08-17 (×2): qty 1

## 2013-08-17 MED ORDER — DIPHENHYDRAMINE HCL 25 MG PO CAPS: 25.0000 mg | ORAL_CAPSULE | Freq: Four times a day (QID) | ORAL | Status: DC | PRN

## 2013-08-17 MED ORDER — ONDANSETRON HCL 4 MG PO TABS: 4.0000 mg | ORAL_TABLET | Freq: Four times a day (QID) | ORAL | Status: DC | PRN

## 2013-08-17 MED ORDER — LIDOCAINE HCL (CARDIAC) 20 MG/ML IV SOLN
INTRAVENOUS | Status: DC | PRN
  Administered 2013-08-17: 60 mg via INTRAVENOUS

## 2013-08-17 MED ORDER — PROPOFOL 10 MG/ML IV BOLUS
INTRAVENOUS | Status: AC
  Filled 2013-08-17: qty 20

## 2013-08-17 MED ORDER — PHENYLEPHRINE 40 MCG/ML (10ML) SYRINGE FOR IV PUSH (FOR BLOOD PRESSURE SUPPORT)
PREFILLED_SYRINGE | INTRAVENOUS | Status: AC
  Filled 2013-08-17: qty 10

## 2013-08-17 MED ORDER — LIDOCAINE HCL (CARDIAC) 20 MG/ML IV SOLN
INTRAVENOUS | Status: AC
  Filled 2013-08-17: qty 5

## 2013-08-17 MED ORDER — LORATADINE 10 MG PO TABS
10.0000 mg | ORAL_TABLET | Freq: Every day | ORAL | Status: DC
  Administered 2013-08-18: 10 mg via ORAL
  Filled 2013-08-17: qty 1

## 2013-08-17 MED ORDER — ONDANSETRON HCL 4 MG/2ML IJ SOLN: 4.0000 mg | Freq: Four times a day (QID) | INTRAMUSCULAR | Status: DC | PRN

## 2013-08-17 SURGICAL SUPPLY — 56 items
BANDAGE ELASTIC 3 VELCRO ST LF (GAUZE/BANDAGES/DRESSINGS) ×2 IMPLANT
BANDAGE ELASTIC 4 VELCRO ST LF (GAUZE/BANDAGES/DRESSINGS) ×2 IMPLANT
BANDAGE GAUZE ELAST BULKY 4 IN (GAUZE/BANDAGES/DRESSINGS) ×6 IMPLANT
BLADE SURG ROTATE 9660 (MISCELLANEOUS) IMPLANT
BNDG ESMARK 4X9 LF (GAUZE/BANDAGES/DRESSINGS) ×2 IMPLANT
CLOTH BEACON ORANGE TIMEOUT ST (SAFETY) IMPLANT
CORDS BIPOLAR (ELECTRODE) ×2 IMPLANT
COVER SURGICAL LIGHT HANDLE (MISCELLANEOUS) ×2 IMPLANT
CUFF TOURNIQUET SINGLE 18IN (TOURNIQUET CUFF) ×2 IMPLANT
CUFF TOURNIQUET SINGLE 24IN (TOURNIQUET CUFF) IMPLANT
DECANTER SPIKE VIAL GLASS SM (MISCELLANEOUS) IMPLANT
DRAIN TLS ROUND 10FR (DRAIN) IMPLANT
DRAPE OEC MINIVIEW 54X84 (DRAPES) ×2 IMPLANT
DRAPE U-SHAPE 47X51 STRL (DRAPES) ×2 IMPLANT
GAUZE XEROFORM 1X8 LF (GAUZE/BANDAGES/DRESSINGS) ×2 IMPLANT
GLOVE ORTHO TXT STRL SZ7.5 (GLOVE) ×2 IMPLANT
GLOVE SS BIOGEL STRL SZ 8 (GLOVE) ×1 IMPLANT
GLOVE SUPERSENSE BIOGEL SZ 8 (GLOVE) ×1
GOWN SRG XL XLNG 56XLVL 4 (GOWN DISPOSABLE) ×3 IMPLANT
GOWN STRL NON-REIN LRG LVL3 (GOWN DISPOSABLE) ×6 IMPLANT
GOWN STRL NON-REIN XL XLG LVL4 (GOWN DISPOSABLE) ×3
KIT BASIN OR (CUSTOM PROCEDURE TRAY) ×2 IMPLANT
KIT ROOM TURNOVER OR (KITS) ×2 IMPLANT
LOOP VESSEL MAXI BLUE (MISCELLANEOUS) IMPLANT
MANIFOLD NEPTUNE II (INSTRUMENTS) ×2 IMPLANT
NEEDLE 22X1 1/2 (OR ONLY) (NEEDLE) ×2 IMPLANT
NS IRRIG 1000ML POUR BTL (IV SOLUTION) ×2 IMPLANT
PACK ORTHO EXTREMITY (CUSTOM PROCEDURE TRAY) ×2 IMPLANT
PAD ARMBOARD 7.5X6 YLW CONV (MISCELLANEOUS) ×4 IMPLANT
PAD CAST 4YDX4 CTTN HI CHSV (CAST SUPPLIES) ×1 IMPLANT
PADDING CAST COTTON 4X4 STRL (CAST SUPPLIES) ×1
PEG LOCKING SMOOTH 2.2X18 (Peg) ×8 IMPLANT
PEG LOCKING SMOOTH 2.2X20 (Screw) ×6 IMPLANT
PLATE NARROW DVR LEFT (Plate) ×2 IMPLANT
PUTTY DBM STAGRAFT PLUS 2CC (Putty) ×2 IMPLANT
SCREW LOCK 12X2.7X 3 LD (Screw) ×3 IMPLANT
SCREW LOCKING 2.7X11MM (Screw) ×2 IMPLANT
SCREW LOCKING 2.7X12MM (Screw) ×3 IMPLANT
SCREW LOCKING 2.7X13MM (Screw) ×2 IMPLANT
SCREW MULTI DIRECTIONAL 2.7X18 (Screw) ×2 IMPLANT
SPONGE GAUZE 4X4 12PLY (GAUZE/BANDAGES/DRESSINGS) ×2 IMPLANT
SPONGE LAP 4X18 X RAY DECT (DISPOSABLE) IMPLANT
SUT MNCRL AB 4-0 PS2 18 (SUTURE) ×2 IMPLANT
SUT PROLENE 3 0 PS 2 (SUTURE) IMPLANT
SUT PROLENE 4 0 PS 2 18 (SUTURE) ×4 IMPLANT
SUT VIC AB 3-0 FS2 27 (SUTURE) IMPLANT
SUT VIC AB 3-0 PS2 18 (SUTURE) ×1
SUT VIC AB 3-0 PS2 18XBRD (SUTURE) ×1 IMPLANT
SYR CONTROL 10ML LL (SYRINGE) ×2 IMPLANT
SYSTEM CHEST DRAIN TLS 7FR (DRAIN) IMPLANT
TOWEL OR 17X24 6PK STRL BLUE (TOWEL DISPOSABLE) ×2 IMPLANT
TOWEL OR 17X26 10 PK STRL BLUE (TOWEL DISPOSABLE) ×2 IMPLANT
TUBE CONNECTING 12X1/4 (SUCTIONS) ×2 IMPLANT
TUBE EVACUATION TLS (MISCELLANEOUS) ×2 IMPLANT
UNDERPAD 30X30 INCONTINENT (UNDERPADS AND DIAPERS) ×2 IMPLANT
WATER STERILE IRR 1000ML POUR (IV SOLUTION) ×2 IMPLANT

## 2013-08-17 NOTE — Anesthesia Preprocedure Evaluation (Addendum)
Anesthesia Evaluation  Patient identified by MRN, date of birth, ID band Patient awake    Reviewed: Allergy & Precautions, H&P , NPO status , Patient's Chart, lab work & pertinent test results  Airway Mallampati: II TM Distance: <3 FB Neck ROM: Full    Dental  (+) Dental Advisory Given and Teeth Intact   Pulmonary          Cardiovascular     Neuro/Psych    GI/Hepatic   Endo/Other    Renal/GU      Musculoskeletal   Abdominal   Peds  Hematology   Anesthesia Other Findings   Reproductive/Obstetrics                          Anesthesia Physical Anesthesia Plan  ASA: II  Anesthesia Plan: General   Post-op Pain Management:    Induction: Intravenous  Airway Management Planned: Oral ETT and LMA  Additional Equipment:   Intra-op Plan:   Post-operative Plan: Extubation in OR  Informed Consent: I have reviewed the patients History and Physical, chart, labs and discussed the procedure including the risks, benefits and alternatives for the proposed anesthesia with the patient or authorized representative who has indicated his/her understanding and acceptance.   Dental advisory given  Plan Discussed with: Anesthesiologist, Surgeon and CRNA  Anesthesia Plan Comments:         Anesthesia Quick Evaluation

## 2013-08-17 NOTE — ED Provider Notes (Signed)
Medical screening examination/treatment/procedure(s) were conducted as a shared visit with non-physician practitioner(s) or resident  and myself.  I personally evaluated the patient during the encounter and agree with the findings and plan unless otherwise indicated.    I have personally reviewed any xrays and/ or EKG's with the provider and I agree with interpretation.   Fall PTA.  Left wrist pain.  No head injury.  Left wrist deformity, nv intact, pain distal radius, decr rom due to pain.  Neck supple, neuro intact.  Ortho arranged transfer to OR. No other injuries.  Pain meds given. NPO  Left distal radial fracture, fall  Mariea Clonts, MD 08/17/13 [phone]

## 2013-08-17 NOTE — Anesthesia Postprocedure Evaluation (Signed)
Anesthesia Post Note  Patient: Jennifer Hopkins  Procedure(s) Performed: Procedure(s) (LRB): OPEN REDUCTION INTERNAL FIXATION (ORIF) DISTAL RADIAL FRACTURE (Left)  Anesthesia type: general  Patient location: PACU  Post pain: Pain level controlled  Post assessment: Patient's Cardiovascular Status Stable  Last Vitals:  Filed Vitals:   08/17/13 1900  BP: 127/73  Pulse: 84  Temp:   Resp: 12    Post vital signs: Reviewed and stable  Level of consciousness: sedated  Complications: No apparent anesthesia complications

## 2013-08-17 NOTE — ED Provider Notes (Signed)
CSN: [id number]     Arrival date & time 08/17/13  1142 History   First MD Initiated Contact with Patient 08/17/13 1227     No chief complaint on file.    (Consider location/radiation/quality/duration/timing/severity/associated sxs/prior Treatment) HPI Comments: Patient is a 63 year old female with history of breast and skin cancer who presents today after a fall outside a convenience store. She reports that the ground was uneven and she tripped. She fell on her outstretched left hand. She is currently experiencing an aching pain from her hand into her forearm. She has not taken anything for the pain. She received one Percocet on arrival here. It has not had to kick in. She denies any other injury. There is no break in the skin, laceration, abrasion. She is right hand dominant. She has not injured this arm in the past. She last ate a handful of peanut M&Ms and drank a Dr. Malachi Bonds at [facility].   The history is provided by the patient. No language interpreter was used.    Past Medical History  Diagnosis Date  . Cancer     Breast   Past Surgical History  Procedure Laterality Date  . Breast lumpectomy  02/2001  . Mohs surgery  2001    at [facility]  . Basal cell carcinoma excision  12/2009   Family History  Problem Relation Age of Onset  . Cancer Mother     Breast Cancer   History  Substance Use Topics  . Smoking status: Never Smoker   . Smokeless tobacco: Not on file  . Alcohol Use: Yes     Comment: occas   OB History   Grav Para Term Preterm Abortions TAB SAB Ect Mult Living                 Review of Systems  Constitutional: Negative for fever and chills.  Respiratory: Negative for shortness of breath.   Cardiovascular: Negative for chest pain.  Gastrointestinal: Negative for nausea, vomiting and abdominal pain.  Musculoskeletal: Positive for arthralgias, joint swelling and myalgias. Negative for back pain and gait problem.  All other systems reviewed and are  negative.      Allergies  Review of patient's allergies indicates no known allergies.  Home Medications   No current outpatient prescriptions on file. BP 123/72  Pulse 81  Temp(Src) 97.9 F (36.6 C) (Oral)  Resp 16  SpO2 97% Physical Exam  Nursing note and vitals reviewed. Constitutional: She is oriented to person, place, and time. She appears well-developed and well-nourished. No distress.  HENT:  Head: Normocephalic and atraumatic.  Right Ear: External ear normal.  Left Ear: External ear normal.  Nose: Nose normal.  Mouth/Throat: Oropharynx is clear and moist.  Eyes: Conjunctivae are normal.  Neck: Normal range of motion.  Cardiovascular: Normal rate, regular rhythm, normal heart sounds, intact distal pulses and normal pulses.   Pulses:      Radial pulses are 2+ on the right side, and 2+ on the left side.  Pulmonary/Chest: Effort normal and breath sounds normal. No stridor. No respiratory distress. She has no wheezes. She has no rales.  Abdominal: Soft. She exhibits no distension.  Musculoskeletal: Normal range of motion.  Deformity to left distal forearm. No abrasions or laceration. Sensation intact through all fingertips. Capillary refill < 3 seconds in all fingers. Patient is able to touch all fingers together. Grip strength 3/5 due to pain, but present.  Pt able to flex and extend wrist, although ROM is limited.   Neurological:  She is alert and oriented to person, place, and time. She has normal strength.  Skin: Skin is warm and dry. She is not diaphoretic. No erythema.  Psychiatric: She has a normal mood and affect. Her behavior is normal.    ED Course  Procedures (including critical care time) Labs Review Labs Reviewed  CBC WITH DIFFERENTIAL - Abnormal; Notable for the following:    Neutrophils Relative % 78 (*)    Neutro Abs 7.9 (*)    All other components within normal limits  BASIC METABOLIC PANEL - Abnormal; Notable for the following:    Potassium 3.4  (*)    Glucose, Bld 138 (*)    All other components within normal limits   Imaging Review Dg Chest 2 View  08/17/2013   CLINICAL DATA:  Traumatic injury with pain  EXAM: CHEST  2 VIEW  COMPARISON:  09/24/2012  FINDINGS: Cardiac shadow is within normal limits. The lungs are clear bilaterally. A scoliosis of the thoracolumbar spine is noted. Postsurgical changes are noted in the axilla bilaterally.  IMPRESSION: No active cardiopulmonary disease.   Electronically Signed   By: Inez Catalina M.D.   On: 08/17/2013 14:55   Dg Forearm Left  08/17/2013   CLINICAL DATA:  Pain post trauma  EXAM: LEFT FOREARM - 2 VIEW  COMPARISON:  None.  FINDINGS: Frontal and lateral views were obtained. There is a comminuted fracture of the distal radial metaphysis with impaction and dorsal angulation distally. There is avulsion ulnar styloid. No other fractures. No dislocation. Joint spaces appear intact.  IMPRESSION: Comminuted fracture distal radius.  Avulsion ulnar styloid.   Electronically Signed   By: Lowella Grip M.D.   On: 08/17/2013 12:55   Dg Wrist Complete Left  08/17/2013   CLINICAL DATA:  Pain post trauma  EXAM: LEFT WRIST - COMPLETE 3+ VIEW  COMPARISON:  None.  FINDINGS: Frontal, oblique, and lateral views were obtained. There is a comminuted, impacted fracture of the distal radial metaphysis with fracture fragments extending into the radiocarpal joint. There is dorsal angulation distally. There is avulsion of the ulnar styloid. No other fractures. No dislocation. 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  IMPRESSION: Comminuted fracture distal radius with fracture fragments extending into the radiocarpal joint. There is dorsal angulation distally as well as a degree of impaction. There is avulsion of the ulnar styloid. No dislocation.   Electronically Signed   By: Lowella Grip M.D.   On: 08/17/2013 12:54     2:09 PM Discussed case with Dr. Amedeo Plenty who will likely surgically repair forearm.  Keep patient NPO and get basic preop labs.   2:18 PM Patient cannot get to the OR until 7pm at [facility] Will transfer patient to [facility] short stay. Dr. Amedeo Plenty will operate there.   MDM   Final diagnoses:  Distal radius fracture, left    Patient presents to ED after [facility]. Comminuted fracture of distal radius with fracture fragments extending into the radiocarpal joint. There is dorsal angulation distal as well as a degree of impaction. Avulsion of ulnar styloid. At this time patient is neurovascularly intact. Sensation intact. Compartment soft. Patient will be kept NPO and transferred to [facility] Short Stay for surgery. Vital signs stable for transfer. Dr. Reather Converse evaluated patient and agrees with plan. Patient / Family / Caregiver informed of clinical course, understand medical decision-making process, and agree with plan.    Elwyn Lade, PA-C 08/17/13 1550

## 2013-08-17 NOTE — Op Note (Signed)
See Dictation#[id number] Amedeo Plenty MD

## 2013-08-17 NOTE — ED Notes (Signed)
Per EMS pt at gas station and fell and catching her self with left wrist. Deformity noted and is splinted. Wrist Iced. Pt in NAD. BP in the 90's.

## 2013-08-17 NOTE — ED Notes (Signed)
[facility] called. 

## 2013-08-17 NOTE — Preoperative (Signed)
Beta Blockers   Reason not to administer Beta Blockers:Not Applicable 

## 2013-08-17 NOTE — ED Notes (Signed)
Ice pack applied to left wrist

## 2013-08-17 NOTE — Progress Notes (Addendum)
Pt requested to get her cell phone from security (locker). Contacted security and brought the cell phone & gave it to the pt. Rest of all her belongings are still in locker, pt said will get it tomorrow. The locker key and the list of things are in the chart. [facility]

## 2013-08-17 NOTE — Transfer of Care (Signed)
Immediate Anesthesia Transfer of Care Note  Patient: Jennifer Hopkins  Procedure(s) Performed: Procedure(s): OPEN REDUCTION INTERNAL FIXATION (ORIF) DISTAL RADIAL FRACTURE (Left)  Patient Location: PACU  Anesthesia Type:General  Level of Consciousness: awake and alert   Airway & Oxygen Therapy: Patient Spontanous Breathing and Patient connected to face mask oxygen  Post-op Assessment: Report given to PACU RN and Post -op Vital signs reviewed and stable  Post vital signs: Reviewed and stable  Complications: No apparent anesthesia complications

## 2013-08-17 NOTE — Anesthesia Procedure Notes (Signed)
Procedure Name: LMA Insertion Date/Time: 08/17/2013 5:14 PM Performed by: Blair Heys E Pre-anesthesia Checklist: Patient identified, Emergency Drugs available, Suction available and Patient being monitored Patient Re-evaluated:Patient Re-evaluated prior to inductionOxygen Delivery Method: Circle system utilized Preoxygenation: Pre-oxygenation with 100% oxygen Intubation Type: IV induction Ventilation: Mask ventilation without difficulty LMA: LMA inserted LMA Size: 4.0 Number of attempts: 1 Placement Confirmation: positive ETCO2 and breath sounds checked- equal and bilateral Tube secured with: Tape Dental Injury: Teeth and Oropharynx as per pre-operative assessment

## 2013-08-17 NOTE — H&P (Signed)
Jennifer Hopkins is an 63 y.o. female.   Chief Complaint: sp fall with radius and ulna fractures left upper extremity HPI: Jennifer KitchenMarland KitchenPatient presents for evaluation and treatment of the of their upper extremity predicament. The patient denies neck back chest or of abdominal pain. The patient notes that they have no lower extremity problems. The patient from primarily complains of the upper extremity pain noted.  Past Medical History  Diagnosis Date  . Cancer     Breast    Past Surgical History  Procedure Laterality Date  . Breast lumpectomy  02/2001  . Mohs surgery  2001    at [facility]  . Basal cell carcinoma excision  12/2009    Family History  Problem Relation Age of Onset  . Cancer Mother     Breast Cancer   Social History:  reports that she has never smoked. She does not have any smokeless tobacco history on file. She reports that she drinks alcohol. She reports that she does not use illicit drugs.  Allergies: No Known Allergies  Medications Prior to Admission  Medication Sig Dispense Refill  . calcium-vitamin D (OSCAL WITH D) 250-125 MG-UNIT per tablet Take 1 tablet by mouth daily.        . cholecalciferol (VITAMIN D) 1000 UNITS tablet Take 1,000 Units by mouth daily. Pt.  Not sure of dosage       . diphenhydrAMINE (SOMINEX) 25 MG tablet Take 25 mg by mouth at bedtime as needed.      . fexofenadine (ALLEGRA) 30 MG tablet Take 30 mg by mouth daily.      Jennifer Hopkins ibuprofen (ADVIL,MOTRIN) 200 MG tablet Take 200 mg by mouth every 6 (six) hours as needed (pain).      . Multiple Vitamin (MULTIVITAMIN) tablet Take 1 tablet by mouth daily.        . pseudoephedrine (SUDAFED) 30 MG tablet Take 30 mg by mouth every 4 (four) hours as needed.          Results for orders placed during the hospital encounter of 08/17/13 (from the past 48 hour(s))  CBC WITH DIFFERENTIAL     Status: Abnormal   Collection Time    08/17/13  2:20 PM      Result Value Range   WBC 10.1  4.0 - 10.5 K/uL   RBC 4.28  3.87 -  5.11 MIL/uL   Hemoglobin 12.9  12.0 - 15.0 g/dL   HCT 37.8  36.0 - 46.0 %   MCV 88.3  78.0 - 100.0 fL   MCH 30.1  26.0 - 34.0 pg   MCHC 34.1  30.0 - 36.0 g/dL   RDW 12.8  11.5 - 15.5 %   Platelets 270  150 - 400 K/uL   Neutrophils Relative % 78 (*) 43 - 77 %   Neutro Abs 7.9 (*) 1.7 - 7.7 K/uL   Lymphocytes Relative 15  12 - 46 %   Lymphs Abs 1.6  0.7 - 4.0 K/uL   Monocytes Relative 7  3 - 12 %   Monocytes Absolute 0.7  0.1 - 1.0 K/uL   Eosinophils Relative 0  0 - 5 %   Eosinophils Absolute 0.0  0.0 - 0.7 K/uL   Basophils Relative 0  0 - 1 %   Basophils Absolute 0.0  0.0 - 0.1 K/uL  BASIC METABOLIC PANEL     Status: Abnormal   Collection Time    08/17/13  2:20 PM      Result Value Range  Sodium 138  137 - 147 mEq/L   Potassium 3.4 (*) 3.7 - 5.3 mEq/L   Chloride 99  96 - 112 mEq/L   CO2 24  19 - 32 mEq/L   Glucose, Bld 138 (*) 70 - 99 mg/dL   BUN 8  6 - 23 mg/dL   Creatinine, Ser 0.68  0.50 - 1.10 mg/dL   Calcium 9.3  8.4 - 10.5 mg/dL   GFR calc non Af Amer >90  >90 mL/min   GFR calc Af Amer >90  >90 mL/min   Comment: (NOTE)     The eGFR has been calculated using the CKD EPI equation.     This calculation has not been validated in all clinical situations.     eGFR's persistently <90 mL/min signify possible Chronic Kidney     Disease.   Dg Chest 2 View  08/17/2013   CLINICAL DATA:  Traumatic injury with pain  EXAM: CHEST  2 VIEW  COMPARISON:  09/24/2012  FINDINGS: Cardiac shadow is within normal limits. The lungs are clear bilaterally. A scoliosis of the thoracolumbar spine is noted. Postsurgical changes are noted in the axilla bilaterally.  IMPRESSION: No active cardiopulmonary disease.   Electronically Signed   By: Inez Catalina M.D.   On: 08/17/2013 14:55   Dg Forearm Left  08/17/2013   CLINICAL DATA:  Pain post trauma  EXAM: LEFT FOREARM - 2 VIEW  COMPARISON:  None.  FINDINGS: Frontal and lateral views were obtained. There is a comminuted fracture of the distal radial  metaphysis with impaction and dorsal angulation distally. There is avulsion ulnar styloid. No other fractures. No dislocation. Joint spaces appear intact.  IMPRESSION: Comminuted fracture distal radius.  Avulsion ulnar styloid.   Electronically Signed   By: Lowella Grip M.D.   On: 08/17/2013 12:55   Dg Wrist Complete Left  08/17/2013   CLINICAL DATA:  Pain post trauma  EXAM: LEFT WRIST - COMPLETE 3+ VIEW  COMPARISON:  None.  FINDINGS: Frontal, oblique, and lateral views were obtained. There is a comminuted, impacted fracture of the distal radial metaphysis with fracture fragments extending into the radiocarpal joint. There is dorsal angulation distally. There is avulsion of the ulnar styloid. No other fractures. No dislocation. 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  IMPRESSION: Comminuted fracture distal radius with fracture fragments extending into the radiocarpal joint. There is dorsal angulation distally as well as a degree of impaction. There is avulsion of the ulnar styloid. No dislocation.   Electronically Signed   By: Lowella Grip M.D.   On: 08/17/2013 12:54    Review of Systems  Constitutional: Negative.   HENT: Negative.   Eyes: Negative.   Respiratory: Negative.   Cardiovascular: Negative.   Gastrointestinal: Negative.   Genitourinary: Negative.   Skin: Negative.   Neurological: Negative.   Endo/Heme/Allergies: Negative.   Psychiatric/Behavioral: Negative.     Blood pressure 113/58, pulse 79, temperature 97.9 F (36.6 C), temperature source Oral, resp. rate 13, SpO2 98.00%. Physical Exam fracture radius and ulna left upper exremity Intact sensation and refill No evidence of compartment syndrome .Jennifer KitchenThe patient is alert and oriented in no acute distress the patient complains of pain in the affected upper extremity.  The patient is noted to have a normal HEENT exam.  Lung fields show equal chest expansion and no shortness of breath  abdomen exam is  nontender without distention.  Lower extremity examination does not show any fracture dislocation or blood clot symptoms.  Pelvis is  stable neck and back are stable and nontender  Assessment/Plan .Jennifer KitchenWe are planning surgery for your upper extremity. The risk and benefits of surgery include risk of bleeding infection anesthesia damage to normal structures and failure of the surgery to accomplish its intended goals of relieving symptoms and restoring function with this in mind we'll going to proceed. I have specifically discussed with the patient the pre-and postoperative regime and the does and don'ts and risk and benefits in great detail. Risk and benefits of surgery also include risk of dystrophy chronic nerve pain failure of the healing process to go onto completion and other inherent risks of surgery The relavent the pathophysiology of the disease/injury process, as well as the alternatives for treatment and postoperative course of action has been discussed in great detail with the patient who desires to proceed.  We will do everything in our power to help you (the patient) restore function to the upper extremity. Is a pleasure to see this patient today.  Plan for ORIF OF THE RADIUS AND ULNA with allograft bone graft as needed   Khiree Bukhari III,Dossie Ocanas M 08/17/2013, 4:59 PM

## 2013-08-18 MED ORDER — SENNA 8.6 MG PO TABS: 1.0000 | ORAL_TABLET | Freq: Two times a day (BID) | ORAL | Status: DC

## 2013-08-18 MED ORDER — METHOCARBAMOL 500 MG PO TABS: 500.0000 mg | ORAL_TABLET | Freq: Four times a day (QID) | ORAL | Status: DC | PRN

## 2013-08-18 MED ORDER — ASCORBIC ACID 1000 MG PO TABS: 1000.0000 mg | ORAL_TABLET | Freq: Every day | ORAL | Status: DC

## 2013-08-18 MED ORDER — OXYCODONE HCL 5 MG PO TABS: 5.0000 mg | ORAL_TABLET | ORAL | Status: DC | PRN

## 2013-08-18 NOTE — Evaluation (Signed)
Occupational Therapy Evaluation Patient Details Name: Jennifer Hopkins MRN: [id number] DOB: [birth year] Today's Date: 08/18/2013 Time: 4098-1191 OT Time Calculation (min): 20 min  OT Assessment / Plan / Recommendation History of present illness sp fall with radius and ulna fractures left upper extremity, s/p radius ORIF on L   Clinical Impression   Pt moving well during evaluation. Education provided and no further OT needs at this time.     OT Assessment  Patient does not need any further OT services    Follow Up Recommendations  No OT follow up    Barriers to Discharge      Equipment Recommendations       Recommendations for Other Services    Frequency       Precautions / Restrictions Precautions Precaution Comments: no lifting, pulling, pushing Restrictions Weight Bearing Restrictions: Yes LUE Weight Bearing: Non weight bearing   Pertinent Vitals/Pain Pt describes pulling feeling in arm-pain not rated. Ice applied and arm elevated.     ADL  Lower Body Dressing: Supervision/safety Where Assessed - Lower Body Dressing: Unsupported sit to stand Toilet Transfer: Modified independent Toilet Transfer Method: Sit to stand Toilet Transfer Equipment: Regular height toilet Tub/Shower Transfer: Supervision/safety Tub/Shower Transfer Method: Therapist, art: Other (comment) (practiced stepping over) Transfers/Ambulation Related to ADLs: Mod I for ambulation. Supervision for tub transfer.  Mod I for sit to stand transfers. ADL Comments: Educated to ice, elevate, and move digits. Performed and educated on retrograde massage. Educated on dressing technique and basics of donning sling/positioning. Recommended someone being with her for tub transfer. Recommended sitting for bathing and dressing. Discussed button up shirts/shirts with big sleeves as well as elastic shoe laces. Discussed precautions. Recommended having family pick up rugs in house and if there  is one in bathroom to be sure it is a non skid rug.     OT Diagnosis:    OT Problem List:   OT Treatment Interventions:     OT Goals(Current goals can be found in the care plan section)    Visit Information  Last OT Received On: 08/18/13 Assistance Needed: +1 History of Present Illness: sp fall with radius and ulna fractures left upper extremity, s/p radius ORIF on L       Prior [place] expects to be discharged to:: Private residence Living Arrangements: Children Available Help at Discharge: Family Type of Home: House Home Access: Stairs to enter [employer] brewer of Steps: 2 Entrance Stairs-Rails: Right Home Layout: One level;Able to live on main level with bedroom/bathroom Home Equipment: None Prior Function Level of Independence: Independent Communication Communication: No difficulties Dominant Hand: Left         Vision/Perception     Cognition  Cognition Arousal/Alertness: Awake/alert Behavior During Therapy: WFL for tasks assessed/performed Overall Cognitive Status: Within Functional Limits for tasks assessed    Extremity/Trunk Assessment Upper Extremity Assessment Upper Extremity Assessment: LUE deficits/detail LUE: Unable to fully assess due to immobilization Lower Extremity Assessment Lower Extremity Assessment: Defer to PT evaluation Cervical / Trunk Assessment Cervical / Trunk Assessment: Normal     Mobility Bed Mobility Overal bed mobility: Needs Assistance Bed Mobility: Supine to Sit;Sit to Supine Supine to sit: Modified independent (Device/Increase time) Sit to supine: Supervision;Modified independent (Device/Increase time) (supervision when positioning in bed) General bed mobility comments: Pt putting weight through LUE when positioning self in bed-cues to avoid this. Transfers Overall transfer level: Modified independent     Exercise Other Exercises Other Exercises:  approximately 20 reps of  digit composite flexion and extension   Balance     End of Session OT - End of Session Activity Tolerance: Patient tolerated treatment well Patient left: in bed;with call bell/phone within reach Nurse Communication: Other (comment) (asked about sling)  GO     Benito Mccreedy OTR/L [phone] 08/18/2013, 11:20 AM

## 2013-08-18 NOTE — Evaluation (Signed)
Physical Therapy Evaluation Patient Details Name: Jennifer Hopkins MRN: [id number] DOB: 01-13-1951 Today's Date: 08/18/2013 Time: 9417-4081 PT Time Calculation (min): 15 min  PT Assessment / Plan / Recommendation History of Present Illness  sp fall with radius and ulna fractures left upper extremity, s/p radius ORIF on L  Clinical Impression  Pt is independent with gait, stairs and mobility, no acute PT needs identified at this time.  Pt educated on importance of elevating UE to decrease swelling, also problem solved ADL tasks with pt.  Pt with no further acute PT needs.    PT Assessment  Patent does not need any further PT services    Follow Up Recommendations  No PT follow up    Does the patient have the potential to tolerate intense rehabilitation      Barriers to Discharge        Equipment Recommendations  None recommended by PT    Recommendations for Other Services     Frequency      Precautions / Restrictions Restrictions Weight Bearing Restrictions: Yes LUE Weight Bearing: Non weight bearing   Pertinent Vitals/Pain Pt c/o of UE fatigue with gait, eased with rest      Mobility  Bed Mobility Overal bed mobility: Independent Transfers Overall transfer level: Independent Ambulation/Gait Ambulation/Gait assistance: Independent Ambulation Distance (Feet): 250 Feet Assistive device: None Gait velocity interpretation: at or above normal speed for age/gender Stairs: Yes Stairs assistance: Modified independent (Device/Increase time) Stair Management: One rail Right Number of Stairs: 5 General stair comments: no LOB    Exercises     PT Diagnosis:    PT Problem List:   PT Treatment Interventions:       PT Goals(Current goals can be found in the care plan section) Acute Rehab PT Goals PT Goal Formulation: No goals set, d/c therapy  Visit Information  Last PT Received On: 08/18/13 History of Present Illness: sp fall with radius and ulna fractures left  upper extremity, s/p radius ORIF on L       Prior Functioning  Home Living Family/patient expects to be discharged to:: Private residence (Simultaneous filing. User may not have seen previous data.) Available Help at Discharge: Family Type of Home: House Home Access: Stairs to enter [employer] of Steps: 2 Entrance Stairs-Rails: Right Home Layout: One level;Able to live on main level with bedroom/bathroom Home Equipment: None    Cognition  Cognition Arousal/Alertness: Awake/alert Behavior During Therapy: WFL for tasks assessed/performed Overall Cognitive Status: Within Functional Limits for tasks assessed    Extremity/Trunk Assessment Upper Extremity Assessment Upper Extremity Assessment: Defer to OT evaluation Lower Extremity Assessment Lower Extremity Assessment: Overall WFL for tasks assessed Cervical / Trunk Assessment Cervical / Trunk Assessment: Normal   Balance    End of Session PT - End of Session Activity Tolerance: Patient tolerated treatment well Patient left: in bed;with call bell/phone within reach Nurse Communication: Mobility status  GP     DONAWERTH,KAREN 08/18/2013, 11:13 AM

## 2013-08-18 NOTE — Discharge Summary (Signed)
  Patient has been seen and examined. Patient has pain appropriate to his injury/process. Patient denies new complaints at this present time. I have discussed his care with nursing staff. Patient is appropriate and alert.  We reviewed vital signs and intake output which are stable.  The upper extremity is neurovascularly intact. Refill is normal. There is no signs of compartment syndrome. There is no signs of dystrophy. There is normal sensation.  Spent a great deal of time discussing range of motion edema control and other techniques to decrease edema and promote flexion extension of the fingers. Patient understands the importance of elevation range of motion massage and other measures to lessen pain and prevent swelling.  We have discussed with the patient shoulder range of motion to prevent adhesive capsulitis.  The remainder of the examination is normal today without complicating feature.  Drain was removed without difficulty  Patient will be discharged home. Will plan to see the patient back in the off as per discharge instructions (please see discharge instructions).  Patient had an uneventful hospital course. At the time of discharge patient is stable awake alert and oriented in no acute distress. Regular diet will be continued and has been tolerated. Patient will notify should have problems occur. There is no signs of DVT infection or other complication at this juncture.  All questions have been incurred and answered.   FINAL DX  : sp orif radius fracture left                      Deryl Ports MD

## 2013-08-18 NOTE — Op Note (Signed)
NAMEMarland Kitchen  Jennifer Hopkins, Jennifer Hopkins NO.:  [id number]  MEDICAL RECORD NO.:  [id number]  LOCATION:  5N16C                        FACILITY:  [facility]  PHYSICIAN:  Satira Anis. Carlin Mamone, M.D.DATE OF BIRTH:  05-19-1951  DATE OF PROCEDURE: DATE OF DISCHARGE:                              OPERATIVE REPORT   PREOPERATIVE DIAGNOSIS:  Comminuted complex intra-articular greater than 5 part distal radius fracture, comminuted in nature with associated ulnar styloid fracture.  POSTOPERATIVE DIAGNOSIS:  Comminuted complex intra-articular greater than 5 part distal radius fracture, comminuted in nature with associated ulnar styloid fracture.  PROCEDURE: 1. Open reduction and internal fixation, distal radius fracture,     comminuted complex greater than 3 part. 2. Stress radiography. 3. Sliding brachioradialis tenotomy. 4. Closed treatment ulnar styloid fracture which was stable under     fluoroscopy.  SURGEON:  Satira Anis. Amedeo Plenty, M.D.  ASSISTANT:  Avelina Laine, P.A.C.  COMPLICATION:  None.  ANESTHESIA:  General.  TOURNIQUET TIME:  Less than one hour.  DRAINS:  One.  INDICATIONS:  A pleasant 63 year old female with same-level fall today with comminuted complex injury.  She denies other injury or problems.  I have discussed the risks, benefits, do's and don'ts.  With all issues in mind, she desires to proceed.  OPERATION IN DETAIL:  She was taken to the operative arena, underwent a general anesthetic.  Time-out called.  Pre and postop check was complete.  Pre and postop check list were secured and following this, I performed a surgical Betadine scrub and paint followed by draping.  Once this was done, we then elevated the arm.  Tourniquet was insufflated to 250 mmHg.  Final time-out was called and the operation commenced with a volar radial incision.  Dissection was carried down.  FCR tendon sheath was incised palmarly and dorsally.  Following this, it was retracted out of harm's  way.  Carpal canal contents were retracted ulnarly.  The pronator was very ripped up in terms of its traumatic injury.  There was a large amount of bone loss centrally in the metaphyseal region and the fracture was highly fragmented, intra-articular in nature.  At this point in time, I very carefully performed a sliding tenotomy at the brachioradialis to try to decrease deforming forces of the distal brachioradialis.  Once this done, I then packed a 2 mL of bone graft from [employer] into the structural defect.  I did discuss this with her preoperatively.  This was an allograft bone graft.  Following this, we applied a DVR plate and screw construct.  We were able to achieve adequate radial height, inclination, volar tilt, and then I was very pleased with how it looked.  Following this, we irrigated, check screw lengths, and noted that the geometry looked quite well.  Live fluoro was then accomplished showing no evidence of obvious intercarpal derangement or instability.  At this point in time, I then very carefully and cautiously irrigated followed by closure of the pronator as best we could, followed by placement of a TLS drain, followed by closure of the skin edge.  The patient had stress radiography and evaluation done under fluoro. The TFC did not appear to be incompetent or overly loose.  Thus, we chose closed  treatment of the ulnar styloid fracture.  She was dressed sterilely.  A 10 mL Sensorcaine was placed for postop analgesia.  Drain was hooked to suction.  We will unclamp in the recovery room.  She will be admitted overnight for IV antibiotics, general postop observation, other measures.  Sugar-tong splint was applied without difficulty.  We will rehab her with immobilization x4 weeks and then very careful cautious gentle mobilization at 4 weeks.  No strengthening until she is 8-10 weeks out and predicated upon x-rays.  These notes have been discussed.  We want to be very careful  with her ulna styloid fracture. I would expect some degree of pain, discomfort here for the months ahead, but nevertheless with a stable TFC and I would pursue a nonsurgical approach.  It was a pleasure participating in care for participating in postop recovery.     Satira Anis. Amedeo Plenty, M.D.     [facility]  D:  08/17/2013  T:  08/18/2013  Job:  [id number]

## 2013-08-18 NOTE — Progress Notes (Signed)
Orthopedic Tech Progress Note Patient Details:  Jennifer Hopkins 1950-09-23 [id number]  Ortho Devices Type of Ortho Device: Arm sling Ortho Device/Splint Interventions: Application   Irish Elders 08/18/2013, 10:51 AM

## 2013-08-18 NOTE — Discharge Instructions (Signed)

## 2013-08-19 ENCOUNTER — Encounter (HOSPITAL_COMMUNITY): Payer: Self-pay | Admitting: Orthopedic Surgery

## 2013-12-13 ENCOUNTER — Telehealth: Payer: Self-pay | Admitting: Hematology and Oncology

## 2013-12-13 ENCOUNTER — Other Ambulatory Visit: Payer: Self-pay | Admitting: Hematology and Oncology

## 2013-12-13 DIAGNOSIS — Z1231 Encounter for screening mammogram for malignant neoplasm of breast: Secondary | ICD-10-CM

## 2013-12-13 NOTE — Telephone Encounter (Signed)
, °

## 2013-12-23 ENCOUNTER — Other Ambulatory Visit: Payer: BC Managed Care – PPO

## 2013-12-23 ENCOUNTER — Ambulatory Visit: Payer: BC Managed Care – PPO | Admitting: Oncology

## 2013-12-29 ENCOUNTER — Other Ambulatory Visit: Payer: Self-pay

## 2014-02-15 ENCOUNTER — Ambulatory Visit
Admission: RE | Admit: 2014-02-15 | Discharge: 2014-02-15 | Disposition: A | Discharge status: Home / Self Care | Payer: BC Managed Care – PPO | Source: Ambulatory Visit | Attending: Hematology and Oncology | Admitting: Hematology and Oncology

## 2014-02-15 ENCOUNTER — Encounter (INDEPENDENT_AMBULATORY_CARE_PROVIDER_SITE_OTHER): Payer: Self-pay

## 2014-02-15 DIAGNOSIS — Z1231 Encounter for screening mammogram for malignant neoplasm of breast: Secondary | ICD-10-CM

## 2014-03-09 ENCOUNTER — Other Ambulatory Visit: Payer: Self-pay | Admitting: *Deleted

## 2014-03-09 DIAGNOSIS — C50919 Malignant neoplasm of unspecified site of unspecified female breast: Secondary | ICD-10-CM

## 2014-03-10 ENCOUNTER — Other Ambulatory Visit (HOSPITAL_BASED_OUTPATIENT_CLINIC_OR_DEPARTMENT_OTHER): Payer: BC Managed Care – PPO

## 2014-03-10 ENCOUNTER — Ambulatory Visit (HOSPITAL_BASED_OUTPATIENT_CLINIC_OR_DEPARTMENT_OTHER): Payer: BC Managed Care – PPO | Admitting: Hematology and Oncology

## 2014-03-10 VITALS — BP 141/84 | HR 96 | Temp 98.2°F | Resp 18 | Ht 62.0 in | Wt 128.3 lb

## 2014-03-10 DIAGNOSIS — C50919 Malignant neoplasm of unspecified site of unspecified female breast: Secondary | ICD-10-CM

## 2014-03-10 DIAGNOSIS — C4491 Basal cell carcinoma of skin, unspecified: Secondary | ICD-10-CM | POA: Insufficient documentation

## 2014-03-10 DIAGNOSIS — Z853 Personal history of malignant neoplasm of breast: Secondary | ICD-10-CM

## 2014-03-10 DIAGNOSIS — C44701 Unspecified malignant neoplasm of skin of unspecified lower limb, including hip: Secondary | ICD-10-CM

## 2014-03-10 DIAGNOSIS — C773 Secondary and unspecified malignant neoplasm of axilla and upper limb lymph nodes: Secondary | ICD-10-CM

## 2014-03-10 DIAGNOSIS — C44721 Squamous cell carcinoma of skin of unspecified lower limb, including hip: Secondary | ICD-10-CM

## 2014-03-10 LAB — COMPREHENSIVE METABOLIC PANEL (CC13)
ALBUMIN: 3.8 g/dL (ref 3.5–5.0)
ALK PHOS: 92 U/L (ref 40–150)
ALT: 14 U/L (ref 0–55)
ANION GAP: 9 meq/L (ref 3–11)
AST: 18 U/L (ref 5–34)
BILIRUBIN TOTAL: 0.66 mg/dL (ref 0.20–1.20)
BUN: 10.6 mg/dL (ref 7.0–26.0)
CO2: 26 mEq/L (ref 22–29)
Calcium: 9.3 mg/dL (ref 8.4–10.4)
Chloride: 106 mEq/L (ref 98–109)
Creatinine: 0.9 mg/dL (ref 0.6–1.1)
GLUCOSE: 104 mg/dL (ref 70–140)
Potassium: 3.3 mEq/L — ABNORMAL LOW (ref 3.5–5.1)
Sodium: 141 mEq/L (ref 136–145)
Total Protein: 7.4 g/dL (ref 6.4–8.3)

## 2014-03-10 LAB — CBC WITH DIFFERENTIAL/PLATELET
BASO%: 0.6 % (ref 0.0–2.0)
BASOS ABS: 0.1 10*3/uL (ref 0.0–0.1)
EOS%: 1.5 % (ref 0.0–7.0)
Eosinophils Absolute: 0.1 10*3/uL (ref 0.0–0.5)
HEMATOCRIT: 38.7 % (ref 34.8–46.6)
HGB: 12.8 g/dL (ref 11.6–15.9)
LYMPH%: 22.5 % (ref 14.0–49.7)
MCH: 29.3 pg (ref 25.1–34.0)
MCHC: 33.1 g/dL (ref 31.5–36.0)
MCV: 88.4 fL (ref 79.5–101.0)
MONO#: 0.9 10*3/uL (ref 0.1–0.9)
MONO%: 10 % (ref 0.0–14.0)
NEUT%: 65.4 % (ref 38.4–76.8)
NEUTROS ABS: 6 10*3/uL (ref 1.5–6.5)
PLATELETS: 321 10*3/uL (ref 145–400)
RBC: 4.38 10*6/uL (ref 3.70–5.45)
RDW: 13.1 % (ref 11.2–14.5)
WBC: 9.1 10*3/uL (ref 3.9–10.3)
lymph#: 2.1 10*3/uL (ref 0.9–3.3)

## 2014-03-10 NOTE — Assessment & Plan Note (Signed)
Right breast cancer diagnosed in 2002 and treated with neoadjuvant chemotherapy followed by surgery followed by additional adjuvant chemotherapy followed by radiation and antiestrogen therapy that was completed in 2007. She has had no evidence of disease recurrence by mammogram and a physical exam. Return to clinic in one year for followup. With another mammogram.  I reviewed her blood work which was normal.Discussed the importance of physical exercise in decreasing the likelihood of breast cancer recurrence. Recommended 30 mins daily 6 days a week of either brisk walking or cycling or swimming. Encouraged patient to eat more fruits and vegetables and decrease red meat.   Patient loves to travel and has plans to go to [facility].

## 2014-03-10 NOTE — Progress Notes (Signed)
Patient Care Team: Jennifer Lasso, MD as PCP - General (General Surgery)  DIAGNOSIS: No matching staging information was found for the patient.  SUMMARY OF ONCOLOGIC HISTORY:   Breast cancer   10/16/2000 Initial Diagnosis Mammogram revealed mass in the right breast biopsy showed IDC with DCIS ER 90% PR 81% Ki-67 6% HER-2 negative   11/10/2000 Surgery Sentinel lymph node study for lymph nodes negative   12/08/2000 - 01/28/2001 Neo-Adjuvant Chemotherapy 4 cycles of Adriamycin and Taxotere   02/06/2001 Surgery Lumpectomy showed residual IMC and DCIS 1.5 cm   03/10/2001 - 04/20/2001 Chemotherapy CMF chemotherapy    Radiation Therapy Radiation therapy to the right breast   05/10/2001 - 05/10/2006 Anti-estrogen oral therapy Tamoxifen 20 mg daily for 5 years   01/09/2010 Relapse/Recurrence 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 Sondra Come    CHIEF COMPLIANT: Basal cell cancers of the skin of her legs  INTERVAL HISTORY: Jennifer Hopkins is a 63 year old Caucasian lady with above-mentioned history of breast cancer and multiple basal cell cancers one of vision even metastasize to the left axillary lymph node. She is here for a one-year followup of her undergoing recent mammogram. She reports no new problems or concerns. She is basal cell cancers in both of her legs which are pink and warm. She'll also travel and has been to multiple countries. She even went to [place] and [place]. She denies any other symptoms currently.   REVIEW OF SYSTEMS:   Constitutional: Denies fevers, chills or abnormal weight loss Eyes: Denies blurriness of vision Ears, nose, mouth, throat, and face: Denies mucositis or sore throat Respiratory: Denies cough, dyspnea or  wheezes Cardiovascular: Denies palpitation, chest discomfort or lower extremity swelling Gastrointestinal:  Denies nausea, heartburn or change in bowel habits Skin: Denies abnormal skin rashes Lymphatics: Denies new lymphadenopathy or easy bruising Neurological:Denies numbness, tingling or new weaknesses Behavioral/Psych: Mood is stable, no new changes  Breast:  denies any pain or lumps or nodules in either breasts All other systems were reviewed with the patient and are negative.  I have reviewed the past medical history, past surgical history, social history and family history with the patient and they are unchanged from previous note.  ALLERGIES:  has No Known Allergies.  MEDICATIONS:  Current Outpatient Prescriptions  Medication Sig Dispense Refill  . calcium-vitamin D (OSCAL WITH D) 250-125 MG-UNIT per tablet Take 1 tablet by mouth daily.        . cholecalciferol (VITAMIN D) 1000 UNITS tablet Take 1,000 Units by mouth daily. Pt.  Not sure of dosage       . diphenhydrAMINE (SOMINEX) 25 MG tablet Take 25 mg by mouth at bedtime as needed.      . fexofenadine (ALLEGRA) 30 MG tablet Take 30 mg by mouth daily.      [name] ibuprofen (ADVIL,MOTRIN) 200 MG tablet Take 200 mg by mouth every 6 (six) hours as needed (pain).      . Multiple Vitamin (MULTIVITAMIN) tablet Take 1 tablet by mouth daily.        . pseudoephedrine (SUDAFED) 30 MG tablet Take 30 mg by mouth every 4 (four) hours as needed.        . vitamin C (VITAMIN C) 1000 MG tablet Take 1 tablet (1,000 mg total) by mouth daily.  120 tablet  0   No current facility-administered medications for this visit.    PHYSICAL EXAMINATION: ECOG PERFORMANCE STATUS: 0 - Asymptomatic  Filed Vitals:   03/10/14 1551  BP: 141/84  Pulse: 96  Temp: 98.2 F (36.8 C)  Resp: 18   Filed Weights   03/10/14 1551  Weight: 128 lb 5 oz (58.202 kg)    GENERAL:alert, no distress and comfortable SKIN: skin color, texture, turgor are normal, no rashes or  significant lesions EYES: normal, Conjunctiva are pink and non-injected, sclera clear OROPHARYNX:no exudate, no erythema and lips, buccal mucosa, and tongue normal  NECK: supple, thyroid normal size, non-tender, without nodularity LYMPH:  no palpable lymphadenopathy in the cervical, axillary or inguinal LUNGS: clear to auscultation and percussion with normal breathing effort HEART: regular rate & rhythm and no murmurs and no lower extremity edema ABDOMEN:abdomen soft, non-tender and normal bowel sounds Musculoskeletal:no cyanosis of digits and no clubbing  NEURO: alert & oriented x 3 with fluent speech, no focal motor/sensory deficits BREAST: No palpable masses lungs or nodules in either right or left breasts. No palpable axillary supraclavicular or infraclavicular adenopathy no breast tenderness or nipple discharge.   LABORATORY DATA:  I have reviewed the data as listed   Chemistry      Component Value Date/Time   NA 141 03/10/2014 1536   NA 138 08/17/2013 1420   NA 143 02/25/2012 1458   K 3.3* 03/10/2014 1536   K 3.4* 08/17/2013 1420   K 3.4 02/25/2012 1458   CL 99 08/17/2013 1420   CL 105 09/29/2012 1000   CL 99 02/25/2012 1458   CO2 26 03/10/2014 1536   CO2 24 08/17/2013 1420   CO2 29 02/25/2012 1458   BUN 10.6 03/10/2014 1536   BUN 8 08/17/2013 1420   BUN 9 02/25/2012 1458   CREATININE 0.9 03/10/2014 1536   CREATININE 0.68 08/17/2013 1420   CREATININE 0.7 02/25/2012 1458      Component Value Date/Time   CALCIUM 9.3 03/10/2014 1536   CALCIUM 9.3 08/17/2013 1420   CALCIUM 9.4 02/25/2012 1458   ALKPHOS 92 03/10/2014 1536   ALKPHOS 89* 02/25/2012 1458   ALKPHOS 73 08/28/2011 1110   AST 18 03/10/2014 1536   AST 25 02/25/2012 1458   AST 20 08/28/2011 1110   ALT 14 03/10/2014 1536   ALT 19 02/25/2012 1458   ALT 10 08/28/2011 1110   BILITOT 0.66 03/10/2014 1536   BILITOT 1.20 02/25/2012 1458   BILITOT 1.1 08/28/2011 1110       Lab Results  Component Value Date   WBC 9.1 03/10/2014   HGB 12.8 03/10/2014    HCT 38.7 03/10/2014   MCV 88.4 03/10/2014   PLT 321 03/10/2014   NEUTROABS 6.0 03/10/2014     RADIOGRAPHIC STUDIES: I have personally reviewed the radiology reports and agreed with their findings. No results found. mammogram done August 2015 was normal  ASSESSMENT & PLAN:  Breast cancer Right breast cancer diagnosed in 2002 and treated with neoadjuvant chemotherapy followed by surgery followed by additional adjuvant chemotherapy followed by radiation and antiestrogen therapy that was completed in 2007. She has had no evidence of disease recurrence by mammogram and a physical exam. Return to clinic in one year for followup. With another mammogram.  I reviewed her blood work which was normal.Discussed the importance of physical exercise in decreasing the likelihood of breast cancer recurrence. Recommended 30 mins daily 6 days a week of either brisk walking or cycling or swimming. Encouraged  patient to eat more fruits and vegetables and decrease red meat.   Patient loves to travel and has plans to go to [facility].  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 Patient still has his cancers on her skin of her shins and she is getting removed by her dermatologist    Orders Placed This Encounter  Procedures  . MM Digital Diagnostic Bilat    Standing Status: Future     Number of Occurrences:      Standing Expiration Date: 03/10/2015    Order Specific Question:  Reason for Exam (SYMPTOM  OR DIAGNOSIS REQUIRED)    Answer:  H/O right breast cancer annual follow up    Order Specific Question:  Preferred imaging location?    Answer:  [facility]  . CBC with Differential    Standing Status: Future     Number of Occurrences:      Standing Expiration Date: 03/10/2015  . Comprehensive metabolic panel (Cmet) - [facility]     Standing Status: Future     Number of Occurrences:      Standing Expiration Date: 03/10/2015   The patient has a good understanding of the overall plan. she agrees with it. She will call with any problems that may develop before her next visit here.  I spent 25 minutes counseling the patient face to face. The total time spent in the appointment was 30 minutes and more than 50% was on counseling and review of test results    Jennifer Eisenmenger, MD 03/10/2014 4:33 PM

## 2014-03-10 NOTE — Assessment & Plan Note (Signed)
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 Patient still has his cancers on her skin of her shins and she is getting removed by her dermatologist

## 2014-03-15 ENCOUNTER — Other Ambulatory Visit: Payer: Self-pay | Admitting: Hematology and Oncology

## 2014-03-15 ENCOUNTER — Telehealth: Payer: Self-pay | Admitting: Hematology and Oncology

## 2014-03-15 DIAGNOSIS — Z853 Personal history of malignant neoplasm of breast: Secondary | ICD-10-CM

## 2014-03-15 DIAGNOSIS — Z9889 Other specified postprocedural states: Secondary | ICD-10-CM

## 2014-03-15 DIAGNOSIS — Z1231 Encounter for screening mammogram for malignant neoplasm of breast: Secondary | ICD-10-CM

## 2014-03-15 NOTE — Telephone Encounter (Signed)
lvm for pt regarding to Aug and Sept appt...mailed pt appt sched/avs and letter °

## 2014-03-23 ENCOUNTER — Other Ambulatory Visit: Payer: Self-pay

## 2014-03-23 ENCOUNTER — Telehealth: Payer: Self-pay | Admitting: Hematology and Oncology

## 2014-03-23 NOTE — Telephone Encounter (Signed)
per pof to add lab to pt appt

## 2015-01-24 ENCOUNTER — Ambulatory Visit: Payer: Medicare Other | Admitting: Internal Medicine

## 2015-01-30 ENCOUNTER — Ambulatory Visit (INDEPENDENT_AMBULATORY_CARE_PROVIDER_SITE_OTHER): Payer: Medicare Other | Admitting: Internal Medicine

## 2015-01-30 ENCOUNTER — Encounter: Payer: Self-pay | Admitting: Internal Medicine

## 2015-01-30 ENCOUNTER — Other Ambulatory Visit (INDEPENDENT_AMBULATORY_CARE_PROVIDER_SITE_OTHER): Payer: Medicare Other

## 2015-01-30 VITALS — BP 132/110 | HR 91 | Temp 98.8°F | Resp 12 | Ht 62.0 in | Wt 130.8 lb

## 2015-01-30 DIAGNOSIS — R030 Elevated blood-pressure reading, without diagnosis of hypertension: Secondary | ICD-10-CM | POA: Diagnosis not present

## 2015-01-30 DIAGNOSIS — Z0000 Encounter for general adult medical examination without abnormal findings: Secondary | ICD-10-CM

## 2015-01-30 LAB — CBC
HEMATOCRIT: 39.8 % (ref 36.0–46.0)
Hemoglobin: 13.5 g/dL (ref 12.0–15.0)
MCHC: 34 g/dL (ref 30.0–36.0)
MCV: 88.6 fl (ref 78.0–100.0)
Platelets: 321 10*3/uL (ref 150.0–400.0)
RBC: 4.49 Mil/uL (ref 3.87–5.11)
RDW: 12.8 % (ref 11.5–15.5)
WBC: 6.3 10*3/uL (ref 4.0–10.5)

## 2015-01-30 LAB — COMPREHENSIVE METABOLIC PANEL
ALBUMIN: 4.2 g/dL (ref 3.5–5.2)
ALT: 12 U/L (ref 0–35)
AST: 22 U/L (ref 0–37)
Alkaline Phosphatase: 75 U/L (ref 39–117)
BILIRUBIN TOTAL: 1 mg/dL (ref 0.2–1.2)
BUN: 10 mg/dL (ref 6–23)
CALCIUM: 9.4 mg/dL (ref 8.4–10.5)
CHLORIDE: 104 meq/L (ref 96–112)
CO2: 29 mEq/L (ref 19–32)
Creatinine, Ser: 0.65 mg/dL (ref 0.40–1.20)
GFR: 96.8 mL/min (ref >60.00)
Glucose, Bld: 100 mg/dL — ABNORMAL HIGH (ref 70–99)
POTASSIUM: 4.1 meq/L (ref 3.5–5.1)
SODIUM: 140 meq/L (ref 135–145)
Total Protein: 7.1 g/dL (ref 6.0–8.3)

## 2015-01-30 LAB — LIPID PANEL
CHOL/HDL RATIO: 4
CHOLESTEROL: 235 mg/dL — AB (ref 0–200)
HDL: 52.7 mg/dL (ref >39.00)
LDL Cholesterol: 144 mg/dL — ABNORMAL HIGH (ref 0–99)
NONHDL: 182.3
TRIGLYCERIDES: 190 mg/dL — AB (ref 0.0–149.0)
VLDL: 38 mg/dL (ref 0.0–40.0)

## 2015-01-30 LAB — TSH: TSH: 1.26 u[IU]/mL (ref 0.35–4.50)

## 2015-01-30 NOTE — Progress Notes (Signed)
   Subjective:    Patient ID: Jennifer Hopkins, female    DOB: 04/08/1951, 64 y.o.   MRN: [id number]  HPI The patient is a new 64 YO female coming in for wellness. No complaints. Wants to know if it is okay for her to take a baby aspirin daily. No chest pains or SOB. Has had colonoscopy in the past but likely more than 10 years ago. Does not remember who did it.   Diet: average Physical activity: sedentary Depression/mood screen: negative Hearing: intact to whispered voice Visual acuity: grossly normal, performs annual eye exam  ADLs: capable Fall risk: none Home safety: good Cognitive evaluation: intact to orientation, naming, recall and repetition EOL planning: adv directives discussed  I have personally reviewed and have noted 1. The patient's medical and social history - reviewed today no changes 2. Their use of alcohol, tobacco or illicit drugs 3. Their current medications and supplements 4. The patient's functional ability including ADL's, fall risks, home safety risks and hearing or visual impairment. 5. Diet and physical activities 6. Evidence for depression or mood disorders 7. Care team reviewed and updated (available in snapshot)  Review of Systems  Constitutional: Negative for fever, activity change, appetite change, fatigue and unexpected weight change.  HENT: Negative.   Eyes: Negative.   Respiratory: Negative for cough, chest tightness, shortness of breath and wheezing.   Cardiovascular: Negative for chest pain, palpitations and leg swelling.  Gastrointestinal: Negative for nausea, abdominal pain, diarrhea, constipation, blood in stool and abdominal distention.  Musculoskeletal: Negative.   Skin: Negative.   Neurological: Negative.   Psychiatric/Behavioral: Negative.       Objective:   Physical Exam  Constitutional: She is oriented to person, place, and time. She appears well-developed and well-nourished.  HENT:  Head: Normocephalic and atraumatic.    Eyes: EOM are normal.  Neck: Normal range of motion.  Cardiovascular: Normal rate and regular rhythm.   Pulmonary/Chest: Effort normal and breath sounds normal. No respiratory distress. She has no wheezes. She has no rales.  Abdominal: Soft. She exhibits no distension. There is no tenderness. There is no rebound.  Musculoskeletal: She exhibits no edema.  Neurological: She is alert and oriented to person, place, and time. Coordination normal.  Skin: Skin is warm and dry.  Psychiatric: She has a normal mood and affect.   Filed Vitals:   01/30/15 1113  BP: 160/96  Pulse: 91  Temp: 98.8 F (37.1 C)  TempSrc: Oral  Resp: 12  Height: 5\' 2"  (1.575 m)  Weight: 130 lb 12.8 oz (59.33 kg)  SpO2: 98%      Assessment & Plan:

## 2015-01-30 NOTE — Progress Notes (Signed)
Pre visit review using our clinic review tool, if applicable. No additional management support is needed unless otherwise documented below in the visit note. 

## 2015-01-30 NOTE — Assessment & Plan Note (Addendum)
Checking labs today, she will check her records of shots and let us know if she needs the pneumonia shot or if she has already had it. Due for colonoscopy and she will let us know when she is ready. Up to date on mammogram and skin check. Non-smoker and talked to her about recommended alcohol intake levels. Recommended routine exercise 3 times weekly and given 10 year screening recommendations.

## 2015-01-30 NOTE — Patient Instructions (Signed)
We will check the blood work today and call you back with the results. We will call you back even if everything is normal.   We will see you back in about 6 months to check on the blood pressure.   Work on exercising about 3-4 times per week to keep your body and heart healthy. It is okay to take a baby aspirin daily for prevention.   See if you can find the records about your shots and call us back. There are 2 pneumonia shots that you need. One is the pneumonia 23 shot and the other is the prevnar 13 which is the pneumonia booster shot.   If you are ready to repeat the colonoscopy we can get you set up with a GI doctor just call and let us know when you are ready.  Health Maintenance Adopting a healthy lifestyle and getting preventive care can go a long way to promote health and wellness. Talk with your health care provider about what schedule of regular examinations is right for you. This is a good chance for you to check in with your provider about disease prevention and staying healthy. In between checkups, there are plenty of things you can do on your own. Experts have done a lot of research about which lifestyle changes and preventive measures are most likely to keep you healthy. Ask your health care provider for more information. WEIGHT AND DIET  Eat a healthy diet  Be sure to include plenty of vegetables, fruits, low-fat dairy products, and lean protein.  Do not eat a lot of foods high in solid fats, added sugars, or salt.  Get regular exercise. This is one of the most important things you can do for your health.  Most adults should exercise for at least 150 minutes each week. The exercise should increase your heart rate and make you sweat (moderate-intensity exercise).  Most adults should also do strengthening exercises at least twice a week. This is in addition to the moderate-intensity exercise.  Maintain a healthy weight  Body mass index (BMI) is a measurement that can be  used to identify possible weight problems. It estimates body fat based on height and weight. Your health care provider can help determine your BMI and help you achieve or maintain a healthy weight.  For females 82 years of age and older:   A BMI below 18.5 is considered underweight.  A BMI of 18.5 to 24.9 is normal.  A BMI of 25 to 29.9 is considered overweight.  A BMI of 30 and above is considered obese.  Watch levels of cholesterol and blood lipids  You should start having your blood tested for lipids and cholesterol at 66 years of age, then have this test every 5 years.  You may need to have your cholesterol levels checked more often if:  Your lipid or cholesterol levels are high.  You are older than 66 years of age.  You are at high risk for heart disease.  CANCER SCREENING   Lung Cancer  Lung cancer screening is recommended for adults 44-36 years old who are at high risk for lung cancer because of a history of smoking.  A yearly low-dose CT scan of the lungs is recommended for people who:  Currently smoke.  Have quit within the past 15 years.  Have at least a 30-pack-year history of smoking. A pack year is smoking an average of one pack of cigarettes a day for 1 year.  Yearly screening should continue  until it has been 15 years since you quit.  Yearly screening should stop if you develop a health problem that would prevent you from having lung cancer treatment.  Breast Cancer  Practice breast self-awareness. This means understanding how your breasts normally appear and feel.  It also means doing regular breast self-exams. Let your health care provider know about any changes, no matter how small.  If you are in your 20s or 30s, you should have a clinical breast exam (CBE) by a health care provider every 1-3 years as part of a regular health exam.  If you are 78 or older, have a CBE every year. Also consider having a breast X-ray (mammogram) every year.  If  you have a family history of breast cancer, talk to your health care provider about genetic screening.  If you are at high risk for breast cancer, talk to your health care provider about having an MRI and a mammogram every year.  Breast cancer gene (BRCA) assessment is recommended for women who have family members with BRCA-related cancers. BRCA-related cancers include:  Breast.  Ovarian.  Tubal.  Peritoneal cancers.  Results of the assessment will determine the need for genetic counseling and BRCA1 and BRCA2 testing. Cervical Cancer Routine pelvic examinations to screen for cervical cancer are no longer recommended for nonpregnant women who are considered low risk for cancer of the pelvic organs (ovaries, uterus, and vagina) and who do not have symptoms. A pelvic examination may be necessary if you have symptoms including those associated with pelvic infections. Ask your health care provider if a screening pelvic exam is right for you.   The Pap test is the screening test for cervical cancer for women who are considered at risk.  If you had a hysterectomy for a problem that was not cancer or a condition that could lead to cancer, then you no longer need Pap tests.  If you are older than 65 years, and you have had normal Pap tests for the past 10 years, you no longer need to have Pap tests.  If you have had past treatment for cervical cancer or a condition that could lead to cancer, you need Pap tests and screening for cancer for at least 20 years after your treatment.  If you no longer get a Pap test, assess your risk factors if they change (such as having a new sexual partner). This can affect whether you should start being screened again.  Some women have medical problems that increase their chance of getting cervical cancer. If this is the case for you, your health care provider may recommend more frequent screening and Pap tests.  The human papillomavirus (HPV) test is another test  that may be used for cervical cancer screening. The HPV test looks for the virus that can cause cell changes in the cervix. The cells collected during the Pap test can be tested for HPV.  The HPV test can be used to screen women 34 years of age and older. Getting tested for HPV can extend the interval between normal Pap tests from three to five years.  An HPV test also should be used to screen women of any age who have unclear Pap test results.  After 66 years of age, women should have HPV testing as often as Pap tests.  Colorectal Cancer  This type of cancer can be detected and often prevented.  Routine colorectal cancer screening usually begins at 66 years of age and continues through 66 years of  age.  Your health care provider may recommend screening at an earlier age if you have risk factors for colon cancer.  Your health care provider may also recommend using home test kits to check for hidden blood in the stool.  A small camera at the end of a tube can be used to examine your colon directly (sigmoidoscopy or colonoscopy). This is done to check for the earliest forms of colorectal cancer.  Routine screening usually begins at age 27.  Direct examination of the colon should be repeated every 5-10 years through 66 years of age. However, you may need to be screened more often if early forms of precancerous polyps or small growths are found. Skin Cancer  Check your skin from head to toe regularly.  Tell your health care provider about any new moles or changes in moles, especially if there is a change in a mole's shape or color.  Also tell your health care provider if you have a mole that is larger than the size of a pencil eraser.  Always use sunscreen. Apply sunscreen liberally and repeatedly throughout the day.  Protect yourself by wearing long sleeves, pants, a wide-brimmed hat, and sunglasses whenever you are outside. HEART DISEASE, DIABETES, AND HIGH BLOOD PRESSURE   Have  your blood pressure checked at least every 1-2 years. High blood pressure causes heart disease and increases the risk of stroke.  If you are between 15 years and 72 years old, ask your health care provider if you should take aspirin to prevent strokes.  Have regular diabetes screenings. This involves taking a blood sample to check your fasting blood sugar level.  If you are at a normal weight and have a low risk for diabetes, have this test once every three years after 66 years of age.  If you are overweight and have a high risk for diabetes, consider being tested at a younger age or more often. PREVENTING INFECTION  Hepatitis B  If you have a higher risk for hepatitis B, you should be screened for this virus. You are considered at high risk for hepatitis B if:  You were born in a country where hepatitis B is common. Ask your health care provider which countries are considered high risk.  Your parents were born in a high-risk country, and you have not been immunized against hepatitis B (hepatitis B vaccine).  You have HIV or AIDS.  You use needles to inject street drugs.  You live with someone who has hepatitis B.  You have had sex with someone who has hepatitis B.  You get hemodialysis treatment.  You take certain medicines for conditions, including cancer, organ transplantation, and autoimmune conditions. Hepatitis C  Blood testing is recommended for:  Everyone born from [birth year] through 1967.  Anyone with known risk factors for hepatitis C. Sexually transmitted infections (STIs)  You should be screened for sexually transmitted infections (STIs) including gonorrhea and chlamydia if:  You are sexually active and are younger than 66 years of age.  You are older than 66 years of age and your health care provider tells you that you are at risk for this type of infection.  Your sexual activity has changed since you were last screened and you are at an increased risk for chlamydia  or gonorrhea. Ask your health care provider if you are at risk.  If you do not have HIV, but are at risk, it may be recommended that you take a prescription medicine daily to prevent HIV infection.  This is called pre-exposure prophylaxis (PrEP). You are considered at risk if:  You are sexually active and do not regularly use condoms or know the HIV status of your partner(s).  You take drugs by injection.  You are sexually active with a partner who has HIV. Talk with your health care provider about whether you are at high risk of being infected with HIV. If you choose to begin PrEP, you should first be tested for HIV. You should then be tested every 3 months for as long as you are taking PrEP.  PREGNANCY   If you are premenopausal and you may become pregnant, ask your health care provider about preconception counseling.  If you may become pregnant, take 400 to 800 micrograms (mcg) of folic acid every day.  If you want to prevent pregnancy, talk to your health care provider about birth control (contraception). OSTEOPOROSIS AND MENOPAUSE   Osteoporosis is a disease in which the bones lose minerals and strength with aging. This can result in serious bone fractures. Your risk for osteoporosis can be identified using a bone density scan.  If you are 38 years of age or older, or if you are at risk for osteoporosis and fractures, ask your health care provider if you should be screened.  Ask your health care provider whether you should take a calcium or vitamin D supplement to lower your risk for osteoporosis.  Menopause may have certain physical symptoms and risks.  Hormone replacement therapy may reduce some of these symptoms and risks. Talk to your health care provider about whether hormone replacement therapy is right for you.  HOME CARE INSTRUCTIONS   Schedule regular health, dental, and eye exams.  Stay current with your immunizations.   Do not use any tobacco products including  cigarettes, chewing tobacco, or electronic cigarettes.  If you are pregnant, do not drink alcohol.  If you are breastfeeding, limit how much and how often you drink alcohol.  Limit alcohol intake to no more than 1 drink per day for nonpregnant women. One drink equals 12 ounces of beer, 5 ounces of wine, or 1 ounces of hard liquor.  Do not use street drugs.  Do not share needles.  Ask your health care provider for help if you need support or information about quitting drugs.  Tell your health care provider if you often feel depressed.  Tell your health care provider if you have ever been abused or do not feel safe at home. Document Released: 01/07/2011 Document Revised: 11/08/2013 Document Reviewed: 05/26/2013 [facility] Patient Information 2015 [place], [place]. This information is not intended to replace advice given to you by your health care provider. Make sure you discuss any questions you have with your health care provider.

## 2015-01-30 NOTE — Assessment & Plan Note (Signed)
She is currently taking allegra-d which could be elevating slightly. Reviewing old records some borderline values. Will bring her back in 6 months for recheck. She will work on regular exercise until then. No medicine today.

## 2015-02-17 ENCOUNTER — Ambulatory Visit
Admission: RE | Admit: 2015-02-17 | Discharge: 2015-02-17 | Disposition: A | Discharge status: Home / Self Care | Payer: Medicare Other | Source: Ambulatory Visit | Attending: Hematology and Oncology | Admitting: Hematology and Oncology

## 2015-02-17 DIAGNOSIS — Z853 Personal history of malignant neoplasm of breast: Secondary | ICD-10-CM

## 2015-02-17 DIAGNOSIS — Z1231 Encounter for screening mammogram for malignant neoplasm of breast: Secondary | ICD-10-CM

## 2015-02-17 DIAGNOSIS — Z9889 Other specified postprocedural states: Secondary | ICD-10-CM

## 2015-03-15 ENCOUNTER — Other Ambulatory Visit: Payer: Self-pay

## 2015-03-15 DIAGNOSIS — C50919 Malignant neoplasm of unspecified site of unspecified female breast: Secondary | ICD-10-CM

## 2015-03-16 ENCOUNTER — Ambulatory Visit (HOSPITAL_BASED_OUTPATIENT_CLINIC_OR_DEPARTMENT_OTHER): Payer: Medicare Other | Admitting: Hematology and Oncology

## 2015-03-16 ENCOUNTER — Other Ambulatory Visit (HOSPITAL_BASED_OUTPATIENT_CLINIC_OR_DEPARTMENT_OTHER): Payer: Medicare Other

## 2015-03-16 ENCOUNTER — Encounter: Payer: Self-pay | Admitting: Hematology and Oncology

## 2015-03-16 ENCOUNTER — Telehealth: Payer: Self-pay | Admitting: Hematology and Oncology

## 2015-03-16 VITALS — BP 152/83 | HR 92 | Temp 98.4°F | Resp 18 | Ht 62.0 in | Wt 130.5 lb

## 2015-03-16 DIAGNOSIS — C50919 Malignant neoplasm of unspecified site of unspecified female breast: Secondary | ICD-10-CM

## 2015-03-16 DIAGNOSIS — C50911 Malignant neoplasm of unspecified site of right female breast: Secondary | ICD-10-CM

## 2015-03-16 DIAGNOSIS — Z853 Personal history of malignant neoplasm of breast: Secondary | ICD-10-CM | POA: Diagnosis not present

## 2015-03-16 DIAGNOSIS — C4491 Basal cell carcinoma of skin, unspecified: Secondary | ICD-10-CM

## 2015-03-16 DIAGNOSIS — C44711 Basal cell carcinoma of skin of unspecified lower limb, including hip: Secondary | ICD-10-CM

## 2015-03-16 NOTE — Assessment & Plan Note (Signed)
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 Patient still has his cancers on her skin of her shins and she is getting removed by her dermatologist

## 2015-03-16 NOTE — Telephone Encounter (Signed)
[place] calendar for September 2017

## 2015-03-16 NOTE — Assessment & Plan Note (Signed)
Right breast cancer diagnosed in 2002 and treated with neoadjuvant chemotherapy followed by surgery followed by additional adjuvant chemotherapy followed by radiation and antiestrogen therapy that was completed in 2007.  Breast Cancer Surveillance: 1. Breast exam 03/16/2015: Normal 2. Mammogram 02/17/2015 No abnormalities. Postsurgical changes. Breast Density Category C. I recommended that she get 3-D mammograms for surveillance. Discussed the differences between different breast density categories.  Patient loves to travel Return to clinic in 1 year to follow with survivorship nurse practitioner

## 2015-03-16 NOTE — Progress Notes (Signed)
Patient Care Team: Olga Millers, MD as PCP - General (Internal Medicine)  DIAGNOSIS: Breast cancer, right breast   Staging form: Breast, AJCC 7th Edition     Clinical: No stage assigned - Unsigned     Pathologic: Stage IA (T1c, N0, cM0) - Signed by Rulon Eisenmenger, MD on 03/10/2014   SUMMARY OF ONCOLOGIC HISTORY:   Breast cancer, right breast   10/16/2000 Initial Diagnosis Mammogram revealed mass in the right breast biopsy showed IDC with DCIS ER 90% PR 81% Ki-67 6% HER-2 negative   11/10/2000 Surgery Sentinel lymph node study for lymph nodes negative   12/08/2000 - 01/28/2001 Neo-Adjuvant Chemotherapy 4 cycles of Adriamycin and Taxotere   02/06/2001 Surgery Lumpectomy showed residual IMC and DCIS 1.5 cm   03/10/2001 - 04/20/2001 Chemotherapy CMF chemotherapy    Radiation Therapy Radiation therapy to the right breast   05/10/2001 - 05/10/2006 Anti-estrogen oral therapy Tamoxifen 20 mg daily for 5 years   01/09/2010 Relapse/Recurrence 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 Sondra Come    CHIEF COMPLIANT: Annual follow-up and breast cancer surveillance  INTERVAL HISTORY: Jennifer Hopkins is a 64 year old with above-mentioned history of right breast cancer treated with lumpectomy followed by adjuvant chemotherapy, radiation and tamoxifen 5 years completed in 2007. 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 She is doing very well and has been traveling the world. She has seen [place] and [place] last year. She feels very healthy and well. Denies any lumps or nodules in the breasts.  REVIEW OF SYSTEMS:   Constitutional: Denies fevers, chills or abnormal weight  loss Eyes: Denies blurriness of vision Ears, nose, mouth, throat, and face: Denies mucositis or sore throat Respiratory: Denies cough, dyspnea or wheezes Cardiovascular: Denies palpitation, chest discomfort or lower extremity swelling Gastrointestinal:  Denies nausea, heartburn or change in bowel habits Skin: Denies abnormal skin rashes Lymphatics: Denies new lymphadenopathy or easy bruising Neurological:Denies numbness, tingling or new weaknesses Behavioral/Psych: Mood is stable, no new changes  Breast:  denies any pain or lumps or nodules in either breasts All other systems were reviewed with the patient and are negative.  I have reviewed the past medical history, past surgical history, social history and family history with the patient and they are unchanged from previous note.  ALLERGIES:  has No Known Allergies.  MEDICATIONS:  Current Outpatient Prescriptions  Medication Sig Dispense Refill  . calcium-vitamin D (OSCAL WITH D) 250-125 MG-UNIT per tablet Take 1 tablet by mouth daily.      . cholecalciferol (VITAMIN D) 1000 UNITS tablet Take 1,000 Units by mouth daily. Pt.  Not sure of dosage     . co-enzyme Q-10 30 MG capsule Take 30 mg by mouth 3 (three) times daily.    . diphenhydrAMINE (SOMINEX) 25 MG tablet Take 25 mg by mouth at bedtime as needed.    . fexofenadine (ALLEGRA) 30 MG tablet Take 30 mg by mouth daily.    [name] ibuprofen (ADVIL,MOTRIN) 200 MG tablet Take 200 mg by mouth every 6 (six) hours as needed (pain).    . Multiple Vitamin (MULTIVITAMIN) tablet Take 1 tablet by mouth daily.      . Omega-3 Fatty Acids ([place])  1000 MG CAPS Take by mouth.    . pseudoephedrine (SUDAFED) 30 MG tablet Take 30 mg by mouth every 4 (four) hours as needed.      . vitamin C (VITAMIN C) 1000 MG tablet Take 1 tablet (1,000 mg total) by mouth daily. 120 tablet 0   No current facility-administered medications for this visit.    PHYSICAL EXAMINATION: ECOG PERFORMANCE STATUS: 0 -  Asymptomatic  Filed Vitals:   03/16/15 0901  BP: 152/83  Pulse: 92  Temp: 98.4 F (36.9 C)  Resp: 18   Filed Weights   03/16/15 0901  Weight: 130 lb 8 oz (59.194 kg)    GENERAL:alert, no distress and comfortable SKIN: skin color, texture, turgor are normal, no rashes or significant lesions EYES: normal, Conjunctiva are pink and non-injected, sclera clear OROPHARYNX:no exudate, no erythema and lips, buccal mucosa, and tongue normal  NECK: supple, thyroid normal size, non-tender, without nodularity LYMPH:  no palpable lymphadenopathy in the cervical, axillary or inguinal LUNGS: clear to auscultation and percussion with normal breathing effort HEART: regular rate & rhythm and no murmurs and no lower extremity edema ABDOMEN:abdomen soft, non-tender and normal bowel sounds Musculoskeletal:no cyanosis of digits and no clubbing  NEURO: alert & oriented x 3 with fluent speech, no focal motor/sensory deficits BREAST: No palpable masses or nodules in either right or left breasts. No palpable axillary supraclavicular or infraclavicular adenopathy no breast tenderness or nipple discharge. (exam performed in the presence of a chaperone)  LABORATORY DATA:  I have reviewed the data as listed   Chemistry      Component Value Date/Time   NA 140 01/30/2015 1203   NA 141 03/10/2014 1536   NA 143 02/25/2012 1458   K 4.1 01/30/2015 1203   K 3.3* 03/10/2014 1536   K 3.4 02/25/2012 1458   CL 104 01/30/2015 1203   CL 105 09/29/2012 1000   CL 99 02/25/2012 1458   CO2 29 01/30/2015 1203   CO2 26 03/10/2014 1536   CO2 29 02/25/2012 1458   BUN 10 01/30/2015 1203   BUN 10.6 03/10/2014 1536   BUN 9 02/25/2012 1458   CREATININE 0.65 01/30/2015 1203   CREATININE 0.9 03/10/2014 1536   CREATININE 0.7 02/25/2012 1458      Component Value Date/Time   CALCIUM 9.4 01/30/2015 1203   CALCIUM 9.3 03/10/2014 1536   CALCIUM 9.4 02/25/2012 1458   ALKPHOS 75 01/30/2015 1203   ALKPHOS 92 03/10/2014 1536    ALKPHOS 89* 02/25/2012 1458   AST 22 01/30/2015 1203   AST 18 03/10/2014 1536   AST 25 02/25/2012 1458   ALT 12 01/30/2015 1203   ALT 14 03/10/2014 1536   ALT 19 02/25/2012 1458   BILITOT 1.0 01/30/2015 1203   BILITOT 0.66 03/10/2014 1536   BILITOT 1.20 02/25/2012 1458       Lab Results  Component Value Date   WBC 6.3 01/30/2015   HGB 13.5 01/30/2015   HCT 39.8 01/30/2015   MCV 88.6 01/30/2015   PLT 321.0 01/30/2015   NEUTROABS 6.0 03/10/2014   ASSESSMENT & PLAN:  Breast cancer, right breast Right breast cancer diagnosed in 2002 and treated with neoadjuvant chemotherapy followed by surgery followed by additional adjuvant chemotherapy followed by radiation and antiestrogen therapy that was completed in 2007.  Breast Cancer Surveillance: 1. Breast exam 03/16/2015: Normal 2. Mammogram 02/17/2015 No abnormalities. Postsurgical changes. Breast Density Category C. I recommended that she get 3-D mammograms for surveillance. Discussed the differences between different breast  density categories.  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 Patient still has his cancers on her skin of her shins and she is getting removed by her dermatologist   No orders of the defined types were placed in this encounter.   The patient has a good understanding of the overall plan. she agrees with it. she will call with any problems that may develop before the next visit here.   Rulon Eisenmenger, MD

## 2015-08-02 ENCOUNTER — Other Ambulatory Visit (INDEPENDENT_AMBULATORY_CARE_PROVIDER_SITE_OTHER): Payer: Medicare Other

## 2015-08-02 ENCOUNTER — Ambulatory Visit (INDEPENDENT_AMBULATORY_CARE_PROVIDER_SITE_OTHER): Payer: Medicare Other | Admitting: Internal Medicine

## 2015-08-02 ENCOUNTER — Encounter: Payer: Self-pay | Admitting: Internal Medicine

## 2015-08-02 VITALS — BP 138/68 | HR 103 | Temp 98.2°F | Resp 16 | Ht 62.0 in | Wt 132.0 lb

## 2015-08-02 DIAGNOSIS — E785 Hyperlipidemia, unspecified: Secondary | ICD-10-CM | POA: Diagnosis not present

## 2015-08-02 DIAGNOSIS — Z23 Encounter for immunization: Secondary | ICD-10-CM

## 2015-08-02 DIAGNOSIS — R030 Elevated blood-pressure reading, without diagnosis of hypertension: Secondary | ICD-10-CM

## 2015-08-02 LAB — LIPID PANEL
CHOLESTEROL: 242 mg/dL — AB (ref 0–200)
HDL: 58.1 mg/dL (ref >39.00)
LDL Cholesterol: 151 mg/dL — ABNORMAL HIGH (ref 0–99)
NONHDL: 183.4
TRIGLYCERIDES: 162 mg/dL — AB (ref 0.0–149.0)
Total CHOL/HDL Ratio: 4
VLDL: 32.4 mg/dL (ref 0.0–40.0)

## 2015-08-02 NOTE — Progress Notes (Signed)
Pre visit review using our clinic review tool, if applicable. No additional management support is needed unless otherwise documented below in the visit note. 

## 2015-08-02 NOTE — Patient Instructions (Signed)
We will check the cholesterol today and call you back with the results.  Good work with the exercise and the wine. Keep up the good work!  Exercising to Stay Healthy Exercising regularly is important. It has many health benefits, such as:  Improving your overall fitness, flexibility, and endurance.  Increasing your bone density.  Helping with weight control.  Decreasing your body fat.  Increasing your muscle strength.  Reducing stress and tension.  Improving your overall health. In order to become healthy and stay healthy, it is recommended that you do moderate-intensity and vigorous-intensity exercise. You can tell that you are exercising at a moderate intensity if you have a higher heart rate and faster breathing, but you are still able to hold a conversation. You can tell that you are exercising at a vigorous intensity if you are breathing much harder and faster and cannot hold a conversation while exercising. HOW OFTEN SHOULD I EXERCISE? Choose an activity that you enjoy and set realistic goals. Your health care provider can help you to make an activity plan that works for you. Exercise regularly as directed by your health care provider. This may include:   Doing resistance training twice each week, such as:  Push-ups.  Sit-ups.  Lifting weights.  Using resistance bands.  Doing a given intensity of exercise for a given amount of time. Choose from these options:  150 minutes of moderate-intensity exercise every week.  75 minutes of vigorous-intensity exercise every week.  A mix of moderate-intensity and vigorous-intensity exercise every week. Children, pregnant women, people who are out of shape, people who are overweight, and older adults may need to consult a health care provider for individual recommendations. If you have any sort of medical condition, be sure to consult your health care provider before starting a new exercise program.  WHAT ARE SOME EXERCISE  IDEAS? Some moderate-intensity exercise ideas include:   Walking at a rate of 1 mile in 15 minutes.  Biking.  Hiking.  Golfing.  Dancing. Some vigorous-intensity exercise ideas include:   Walking at a rate of at least 4.5 miles per hour.  Jogging or running at a rate of 5 miles per hour.  Biking at a rate of at least 10 miles per hour.  Lap swimming.  Roller-skating or in-line skating.  Cross-country skiing.  Vigorous competitive sports, such as football, basketball, and soccer.  Jumping rope.  Aerobic dancing. WHAT ARE SOME EVERYDAY ACTIVITIES THAT CAN HELP ME TO GET EXERCISE?  Yard work, such as:  Psychologist, educational.  Raking and bagging leaves.  Washing and waxing your car.  Pushing a stroller.  Shoveling snow.  Gardening.  Washing windows or floors. HOW CAN I BE MORE ACTIVE IN MY DAY-TO-DAY ACTIVITIES?  Use the stairs instead of the elevator.  Take a walk during your lunch break.  If you drive, park your car farther away from work or school.  If you take public transportation, get off one stop early and walk the rest of the way.  Make all of your phone calls while standing up and walking around.  Get up, stretch, and walk around every 30 minutes throughout the day. WHAT GUIDELINES SHOULD I FOLLOW WHILE EXERCISING?  Do not exercise so much that you hurt yourself, feel dizzy, or get very short of breath.  Consult your health care provider before starting a new exercise program.  Wear comfortable clothes and shoes with good support.  Drink plenty of water while you exercise to prevent dehydration or  heat stroke. Body water is lost during exercise and must be replaced.  Work out until you breathe faster and your heart beats faster.   This information is not intended to replace advice given to you by your health care provider. Make sure you discuss any questions you have with your health care provider.   Document Released: 07/27/2010 Document  Revised: 07/15/2014 Document Reviewed: 11/25/2013 Elsevier Interactive Patient Education [employer].

## 2015-08-04 DIAGNOSIS — E785 Hyperlipidemia, unspecified: Secondary | ICD-10-CM | POA: Insufficient documentation

## 2015-08-04 NOTE — Progress Notes (Signed)
   Subjective:    Patient ID: Jennifer Hopkins, female    DOB: [birth year], 65 y.o.   MRN: [id number]  HPI The patient is a 65 YO female coming in for follow up of her cholesterol and her blood pressure. The cholesterol was elevated on the last blood work. She has been working on diet and exercise to help out. She is also limiting her alcohol to 7 drinks per week per my advice last time. No complaints. No chest pains, SOB, headaches.   Review of Systems  Constitutional: Negative for fever, activity change, appetite change, fatigue and unexpected weight change.  HENT: Negative.   Eyes: Negative.   Respiratory: Negative for cough, chest tightness, shortness of breath and wheezing.   Cardiovascular: Negative for chest pain, palpitations and leg swelling.  Gastrointestinal: Negative for nausea, abdominal pain, diarrhea, constipation, blood in stool and abdominal distention.  Musculoskeletal: Negative.   Skin: Negative.   Neurological: Negative.   Psychiatric/Behavioral: Negative.       Objective:   Physical Exam  Constitutional: She is oriented to person, place, and time. She appears well-developed and well-nourished.  HENT:  Head: Normocephalic and atraumatic.  Eyes: EOM are normal.  Neck: Normal range of motion.  Cardiovascular: Normal rate and regular rhythm.   Pulmonary/Chest: Effort normal and breath sounds normal. No respiratory distress. She has no wheezes. She has no rales.  Abdominal: Soft. She exhibits no distension. There is no tenderness. There is no rebound.  Musculoskeletal: She exhibits no edema.  Neurological: She is alert and oriented to person, place, and time. Coordination normal.  Skin: Skin is warm and dry.  Psychiatric: She has a normal mood and affect.   Filed Vitals:   08/02/15 1110 08/02/15 1145  BP: 148/72 138/68  Pulse: 103   Temp: 98.2 F (36.8 C)   TempSrc: Oral   Resp: 16   Height: 5\' 2"  (1.575 m)   Weight: 132 lb (59.875 kg)   SpO2: 97%      Assessment & Plan:  Prevnar 13 given at visit.

## 2015-08-04 NOTE — Assessment & Plan Note (Signed)
BP at goal today, will continue to monitor.

## 2015-08-04 NOTE — Assessment & Plan Note (Signed)
LDL was above goal last time. She has made diet and exercise changes. Rechecking today. If still above goal she wishes to try another 6 months with diet and exercise. If no improvement would be appropriate to start statin therapy.

## 2015-08-31 DIAGNOSIS — Z1211 Encounter for screening for malignant neoplasm of colon: Secondary | ICD-10-CM | POA: Diagnosis not present

## 2015-08-31 DIAGNOSIS — K5900 Constipation, unspecified: Secondary | ICD-10-CM | POA: Diagnosis not present

## 2015-09-04 ENCOUNTER — Encounter: Payer: Self-pay | Admitting: *Deleted

## 2015-09-04 NOTE — Progress Notes (Signed)
Received office notes from [facility]. Reviewed by Dr. Lindi Adie, sent to scan.

## 2015-10-16 DIAGNOSIS — Z1211 Encounter for screening for malignant neoplasm of colon: Secondary | ICD-10-CM | POA: Diagnosis not present

## 2015-10-16 DIAGNOSIS — Z86010 Personal history of colonic polyps: Secondary | ICD-10-CM | POA: Diagnosis not present

## 2015-10-16 DIAGNOSIS — D128 Benign neoplasm of rectum: Secondary | ICD-10-CM | POA: Diagnosis not present

## 2015-10-16 DIAGNOSIS — K621 Rectal polyp: Secondary | ICD-10-CM | POA: Diagnosis not present

## 2015-11-16 ENCOUNTER — Encounter: Payer: Self-pay | Admitting: Geriatric Medicine

## 2016-01-10 ENCOUNTER — Other Ambulatory Visit: Payer: Self-pay | Admitting: Hematology and Oncology

## 2016-01-10 DIAGNOSIS — Z1231 Encounter for screening mammogram for malignant neoplasm of breast: Secondary | ICD-10-CM

## 2016-02-19 ENCOUNTER — Ambulatory Visit
Admission: RE | Admit: 2016-02-19 | Discharge: 2016-02-19 | Disposition: A | Discharge status: Home / Self Care | Payer: Medicare Other | Source: Ambulatory Visit | Attending: Hematology and Oncology | Admitting: Hematology and Oncology

## 2016-02-19 DIAGNOSIS — Z1231 Encounter for screening mammogram for malignant neoplasm of breast: Secondary | ICD-10-CM

## 2016-03-14 ENCOUNTER — Ambulatory Visit (HOSPITAL_BASED_OUTPATIENT_CLINIC_OR_DEPARTMENT_OTHER): Payer: Medicare Other | Admitting: Adult Health

## 2016-03-14 ENCOUNTER — Telehealth: Payer: Self-pay | Admitting: Adult Health

## 2016-03-14 VITALS — BP 154/91 | HR 84 | Temp 97.8°F | Resp 18 | Ht 62.0 in | Wt 128.0 lb

## 2016-03-14 DIAGNOSIS — C4491 Basal cell carcinoma of skin, unspecified: Secondary | ICD-10-CM | POA: Diagnosis not present

## 2016-03-14 DIAGNOSIS — Z1231 Encounter for screening mammogram for malignant neoplasm of breast: Secondary | ICD-10-CM | POA: Diagnosis not present

## 2016-03-14 DIAGNOSIS — C50911 Malignant neoplasm of unspecified site of right female breast: Secondary | ICD-10-CM

## 2016-03-14 NOTE — Progress Notes (Signed)
CLINIC:  Survivorship   REASON FOR VISIT:  Routine follow-up for history of breast cancer.   BRIEF ONCOLOGIC HISTORY:    Breast cancer, right breast ([place])   10/16/2000 Initial Diagnosis    Mammogram revealed mass in the right breast biopsy showed IDC with DCIS ER 90% PR 81% Ki-67 6% HER-2 negative      11/10/2000 Surgery    Sentinel lymph node study for lymph nodes negative      12/08/2000 - 01/28/2001 Neo-Adjuvant Chemotherapy    4 cycles of Adriamycin and Taxotere      02/06/2001 Surgery    Lumpectomy showed residual IMC and DCIS 1.5 cm      03/10/2001 - 04/20/2001 Chemotherapy    CMF chemotherapy       Radiation Therapy    Radiation therapy to the right breast      05/10/2001 - 05/10/2006 Anti-estrogen oral therapy    Tamoxifen 20 mg daily for 5 years      01/09/2010 Relapse/Recurrence    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 Sondra Hopkins       INTERVAL HISTORY:  Jennifer Hopkins presents to the [facility] today for routine follow-up for her history of breast cancer.  Overall, she reports feeling quite well. She has some occasional heartburn/gas symptoms in her LUQ, but other than that is largely without complaints.   She will be traveling to [place] to see the [place] of [place] on her "bucket list."  She really enjoys traveling and has created several different bucket lists; she is not sure where she will go after she has seen [place]. She has all of her vaccinations for this destination, but would like a prophylactic prescription for Cipro to take in case of GI illness while abroad. She has a PCP, but she cannot get in there to be seen before her trip.   She remains very  physically active, walking 5 days per week.  Her diet is good; energy levels are good. She has occasional hot flashes, but they are not severe or bothersome for her.     REVIEW OF SYSTEMS:  Review of Systems  Constitutional: Negative.   HENT: Negative.   Eyes: Negative.   Respiratory: Negative.   Cardiovascular: Negative.   Gastrointestinal: Positive for abdominal pain and heartburn.  Genitourinary: Negative.   Musculoskeletal: Negative.   Skin: Negative.   Neurological: Negative.   Endo/Heme/Allergies:       Periodic hot flashes.   Psychiatric/Behavioral: Negative.   GU: Denies vaginal bleeding, discharge, or dryness.  Breast: Denies any new nodularity, masses, tenderness, nipple changes, or nipple discharge.    A 14-point review of systems was completed and was negative, except as noted above.    PAST MEDICAL/SURGICAL HISTORY:  Past Medical History:  Diagnosis Date  . Allergy   . Cancer (HCC)    Breast  . Rheumatic fever    Past Surgical History:  Procedure Laterality Date  . BASAL CELL CARCINOMA EXCISION  12/2009  . BREAST LUMPECTOMY  02/2001  . [place]  2001   at [facility]  . OPEN REDUCTION INTERNAL FIXATION (ORIF) DISTAL RADIAL FRACTURE Left 08/17/2013   Procedure:  OPEN REDUCTION INTERNAL FIXATION (ORIF) DISTAL RADIAL FRACTURE;  Surgeon: Jennifer Kaufman, MD;  Location: [facility];  Service: Orthopedics;  Laterality: Left;     ALLERGIES:  No Known Allergies   CURRENT MEDICATIONS:  Outpatient Encounter Prescriptions as of 03/14/2016  Medication Sig  . calcium-vitamin D (OSCAL WITH D) 250-125 MG-UNIT per tablet Take 1 tablet by mouth daily.    . cholecalciferol (VITAMIN D) 1000 UNITS tablet Take 1,000 Units by mouth daily. Pt.  Not sure of dosage   . co-enzyme Q-10 30 MG capsule Take 30 mg by mouth 3 (three) times daily.  . diphenhydrAMINE (SOMINEX) 25 MG tablet Take 25 mg by mouth at bedtime as needed.  . fexofenadine (ALLEGRA) 30 MG tablet Take 30 mg by mouth daily.   . Flaxseed, Linseed, (FLAX SEED OIL PO) Take by mouth.  [name] ibuprofen (ADVIL,MOTRIN) 200 MG tablet Take 200 mg by mouth every 6 (six) hours as needed (pain).  . Multiple Vitamin (MULTIVITAMIN) tablet Take 1 tablet by mouth daily.    . Omega-3 Fatty Acids (FISH OIL BURP-LESS) 1000 MG CAPS Take by mouth. Reported on 08/02/2015  . pseudoephedrine (SUDAFED) 30 MG tablet Take 30 mg by mouth every 4 (four) hours as needed. Reported on 08/02/2015  . vitamin C (VITAMIN C) 1000 MG tablet Take 1 tablet (1,000 mg total) by mouth daily.   No facility-administered encounter medications on file as of 03/14/2016.      ONCOLOGIC FAMILY HISTORY:  Family History  Problem Relation Age of Onset  . Cancer Mother     Breast Cancer  . Hyperlipidemia Father   . Heart disease Father   . Hypertension Father   . Heart disease Paternal Aunt   . Heart disease Paternal Uncle   . Heart disease Paternal Grandmother   . Heart disease Paternal Grandfather     GENETIC COUNSELING/TESTING: No records available for review.   SOCIAL HISTORY:  Jennifer Hopkins is single and lives in [place], [place].  She has 2 children; 1 son and 1 daughter.  She has 1 grandson who is 55 years old . 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  She denies any current tobacco, alcohol, or illicit drug use.     PHYSICAL EXAMINATION:  Vital Signs: Vitals:   03/14/16 0843  BP: (!) 154/91  Pulse: 84  Resp: 18  Temp: 97.8 F (36.6 C)   Filed Weights   03/14/16 0843  Weight: 128 lb (58.1 kg)   General: Well-nourished, well-appearing female in no acute distress.  She is unaccompanied. HEENT: Head is normocephalic.  Pupils equal and reactive to light. Conjunctivae clear without exudate.  Sclerae anicteric. Oral mucosa is pink, moist.  Oropharynx is pink without lesions or erythema.  Lymph: No cervical, supraclavicular, or infraclavicular lymphadenopathy noted on palpation.  Cardiovascular: Regular rate  and rhythm.[name] Respiratory: Clear to auscultation bilaterally. Chest expansion symmetric; breathing non-labored.  Breast Exam:  -Left breast: No appreciable masses on palpation. No skin redness, thickening, or peau d'orange appearance.  -Right breast: No appreciable masses on palpation. No skin redness, thickening, or peau d'orange appearance; mild distortion in symmetry at previous lumpectomy site; healed scar without erythema or nodularity. -Axilla: No axillary adenopathy bilaterally.  GI: Abdomen soft and round; non-tender, non-distended. Bowel sounds normoactive. No hepatosplenomegaly.   GU: Deferred.  Neuro: No focal deficits. Steady gait.  Psych: Mood and affect normal and appropriate for situation.  Extremities: No edema. Skin: Warm and dry.  LABORATORY DATA:  None  for this visit.    DIAGNOSTIC IMAGING:  Most recent mammogram: 02/18/14 EXAM: 2D DIGITAL SCREENING BILATERAL MAMMOGRAM WITH CAD AND ADJUNCT TOMO  COMPARISON:  Previous exam(s).  ACR Breast Density Category c: The breast tissue is heterogeneously dense, which may obscure small masses.  FINDINGS: There are no findings suspicious for malignancy. Stable post lumpectomy changes in the right breast. Images were processed with CAD.  IMPRESSION: No mammographic evidence of malignancy. A result letter of this screening mammogram will be mailed directly to the patient.  RECOMMENDATION: Screening mammogram in one year. (Code:SM-B-01Y)  BI-RADS CATEGORY  2: Benign.    ASSESSMENT AND PLAN:  Ms.. Jennifer Hopkins is a pleasant 65 y.o. female with history of Stage IA right breast invasive ductal carcinoma, ER+/PR+/HER2-, diagnosed in 10/2000, treated with neoadjuvant chemo with Adriamycin/Taxotere, followed by lumpectomy, then adjuvant chemo with CMF.  She went on to complete adjuvant radiation therapy and anti-estrogen therapy with Tamoxifen x 5 years from 05/2001-05/2006.  She presents to the [facility] for  surveillance and routine follow-up.   1. History of Stage IA right breast cancer:  [name] is currently clinically and radiographically without evidence of disease or recurrence of breast cancer. She will follow-up with her medical oncologist, Dr. Lindi Adie, in 03/2017.  She will be due for annual mammogram in 02/2017; orders placed today. I encouraged her to call me with any questions or concerns before her next visit at the cancer center and I would be happy to see her sooner, if needed.    2. Prophylactic antibiotics:  I verified that she has no known drug allergies.  Given that she is unable to get in to see her PCP prior to her overseas travel, I provided her with a paper prescription for Cipro 250 mg po BID x 7 days, #14, no refills.  Wished her well and safe travels.    3. Bone health:  Given [name] age and history of breast cancer, she is at risk for bone demineralization.  We do not have an updated DEXA scan available for review.  Given that she has completed anti-estrogen therapy nearly 10 years ago, I will defer any future bone mineral density assessments to her PCP, as clinically indicated.  She remains physically active and encouraged her to continue to take her calcium and vitamin D supplementation.   4. Health maintenance and wellness promotion: [name] was encouraged to consume 5-7 servings of fruits and vegetables per day. She was also encouraged to engage in moderate to vigorous exercise for 30 minutes per day most days of the week. She was instructed to limit her alcohol consumption and continue to abstain from tobacco use.    Dispo:  -Annual screening mammogram due 02/2017; orders placed today. -Return to cancer center to see Dr. Lindi Adie in 03/2017   A total of 20 minutes of face-to-face time was spent with this patient with greater than 50% of that time in counseling and care-coordination.   Mike Craze, NP [facility] [place] [phone]   Note: PRIMARY CARE PROVIDER Hoyt Koch, [place] [phone]

## 2016-03-14 NOTE — Telephone Encounter (Signed)
appt made and avs printed °

## 2016-03-22 ENCOUNTER — Encounter: Payer: Self-pay | Admitting: Adult Health

## 2016-04-11 DIAGNOSIS — Z23 Encounter for immunization: Secondary | ICD-10-CM | POA: Diagnosis not present

## 2016-08-01 ENCOUNTER — Encounter: Payer: Self-pay | Admitting: Internal Medicine

## 2016-08-01 ENCOUNTER — Ambulatory Visit (INDEPENDENT_AMBULATORY_CARE_PROVIDER_SITE_OTHER): Payer: Medicare Other | Admitting: Internal Medicine

## 2016-08-01 ENCOUNTER — Other Ambulatory Visit (INDEPENDENT_AMBULATORY_CARE_PROVIDER_SITE_OTHER): Payer: Medicare Other

## 2016-08-01 VITALS — BP 130/86 | HR 92 | Temp 98.1°F | Resp 14 | Ht 62.0 in | Wt 124.2 lb

## 2016-08-01 DIAGNOSIS — Z0000 Encounter for general adult medical examination without abnormal findings: Secondary | ICD-10-CM

## 2016-08-01 DIAGNOSIS — J3089 Other allergic rhinitis: Secondary | ICD-10-CM | POA: Diagnosis not present

## 2016-08-01 DIAGNOSIS — Z23 Encounter for immunization: Secondary | ICD-10-CM | POA: Diagnosis not present

## 2016-08-01 DIAGNOSIS — R030 Elevated blood-pressure reading, without diagnosis of hypertension: Secondary | ICD-10-CM | POA: Diagnosis not present

## 2016-08-01 DIAGNOSIS — J309 Allergic rhinitis, unspecified: Secondary | ICD-10-CM | POA: Insufficient documentation

## 2016-08-01 DIAGNOSIS — E785 Hyperlipidemia, unspecified: Secondary | ICD-10-CM

## 2016-08-01 LAB — COMPREHENSIVE METABOLIC PANEL
ALBUMIN: 4.3 g/dL (ref 3.5–5.2)
ALK PHOS: 78 U/L (ref 39–117)
ALT: 10 U/L (ref 0–35)
AST: 16 U/L (ref 0–37)
BILIRUBIN TOTAL: 0.9 mg/dL (ref 0.2–1.2)
BUN: 9 mg/dL (ref 6–23)
CALCIUM: 9.6 mg/dL (ref 8.4–10.5)
CHLORIDE: 103 meq/L (ref 96–112)
CO2: 32 mEq/L (ref 19–32)
CREATININE: 0.71 mg/dL (ref 0.40–1.20)
GFR: 87.03 mL/min (ref >60.00)
Glucose, Bld: 97 mg/dL (ref 70–99)
Potassium: 3.6 mEq/L (ref 3.5–5.1)
Sodium: 139 mEq/L (ref 135–145)
TOTAL PROTEIN: 7.7 g/dL (ref 6.0–8.3)

## 2016-08-01 LAB — CBC
HEMATOCRIT: 39.6 % (ref 36.0–46.0)
Hemoglobin: 13.6 g/dL (ref 12.0–15.0)
MCHC: 34.5 g/dL (ref 30.0–36.0)
MCV: 87.3 fl (ref 78.0–100.0)
Platelets: 361 10*3/uL (ref 150.0–400.0)
RBC: 4.53 Mil/uL (ref 3.87–5.11)
RDW: 13 % (ref 11.5–15.5)
WBC: 7.3 10*3/uL (ref 4.0–10.5)

## 2016-08-01 LAB — LIPID PANEL
CHOLESTEROL: 254 mg/dL — AB (ref 0–200)
HDL: 51.6 mg/dL (ref >39.00)
LDL CALC: 169 mg/dL — AB (ref 0–99)
NonHDL: 202.86
TRIGLYCERIDES: 170 mg/dL — AB (ref 0.0–149.0)
Total CHOL/HDL Ratio: 5
VLDL: 34 mg/dL (ref 0.0–40.0)

## 2016-08-01 MED ORDER — METHYLPREDNISOLONE ACETATE 40 MG/ML IJ SUSP
40.0000 mg | Freq: Once | INTRAMUSCULAR | Status: AC
  Administered 2016-08-01: 40 mg via INTRAMUSCULAR

## 2016-08-01 MED ORDER — CIPROFLOXACIN HCL 250 MG PO TABS: 250.0000 mg | ORAL_TABLET | Freq: Two times a day (BID) | ORAL | 0 refills | Status: DC

## 2016-08-01 NOTE — Patient Instructions (Signed)
We have sent in the cipro for the trip.   We are also checking the labs today.   We have given you the last pneumonia shot today and the steroid shot.   Keep up the good work with the health!  Health Maintenance, Female Introduction Adopting a healthy lifestyle and getting preventive care can go a long way to promote health and wellness. Talk with your health care provider about what schedule of regular examinations is right for you. This is a good chance for you to check in with your provider about disease prevention and staying healthy. In between checkups, there are plenty of things you can do on your own. Experts have done a lot of research about which lifestyle changes and preventive measures are most likely to keep you healthy. Ask your health care provider for more information. Weight and diet Eat a healthy diet  Be sure to include plenty of vegetables, fruits, low-fat dairy products, and lean protein.  Do not eat a lot of foods high in solid fats, added sugars, or salt.  Get regular exercise. This is one of the most important things you can do for your health.  Most adults should exercise for at least 150 minutes each week. The exercise should increase your heart rate and make you sweat (moderate-intensity exercise).  Most adults should also do strengthening exercises at least twice a week. This is in addition to the moderate-intensity exercise. Maintain a healthy weight  Body mass index (BMI) is a measurement that can be used to identify possible weight problems. It estimates body fat based on height and weight. Your health care provider can help determine your BMI and help you achieve or maintain a healthy weight.  For females 77 years of age and older:  A BMI below 18.5 is considered underweight.  A BMI of 18.5 to 24.9 is normal.  A BMI of 25 to 29.9 is considered overweight.  A BMI of 30 and above is considered obese. Watch levels of cholesterol and blood  lipids  You should start having your blood tested for lipids and cholesterol at 68 years of age, then have this test every 5 years.  You may need to have your cholesterol levels checked more often if:  Your lipid or cholesterol levels are high.  You are older than 68 years of age.  You are at high risk for heart disease. Cancer screening Lung Cancer  Lung cancer screening is recommended for adults 36-34 years old who are at high risk for lung cancer because of a history of smoking.  A yearly low-dose CT scan of the lungs is recommended for people who:  Currently smoke.  Have quit within the past 15 years.  Have at least a 30-pack-year history of smoking. A pack year is smoking an average of one pack of cigarettes a day for 1 year.  Yearly screening should continue until it has been 15 years since you quit.  Yearly screening should stop if you develop a health problem that would prevent you from having lung cancer treatment. Breast Cancer  Practice breast self-awareness. This means understanding how your breasts normally appear and feel.  It also means doing regular breast self-exams. Let your health care provider know about any changes, no matter how small.  If you are in your 20s or 30s, you should have a clinical breast exam (CBE) by a health care provider every 1-3 years as part of a regular health exam.  If you are 40 or  older, have a CBE every year. Also consider having a breast X-ray (mammogram) every year.  If you have a family history of breast cancer, talk to your health care provider about genetic screening.  If you are at high risk for breast cancer, talk to your health care provider about having an MRI and a mammogram every year.  Breast cancer gene (BRCA) assessment is recommended for women who have family members with BRCA-related cancers. BRCA-related cancers include:  Breast.  Ovarian.  Tubal.  Peritoneal cancers.  Results of the assessment will  determine the need for genetic counseling and BRCA1 and BRCA2 testing. Cervical Cancer  Your health care provider may recommend that you be screened regularly for cancer of the pelvic organs (ovaries, uterus, and vagina). This screening involves a pelvic examination, including checking for microscopic changes to the surface of your cervix (Pap test). You may be encouraged to have this screening done every 3 years, beginning at age 63.  For women ages 49-65, health care providers may recommend pelvic exams and Pap testing every 3 years, or they may recommend the Pap and pelvic exam, combined with testing for human papilloma virus (HPV), every 5 years. Some types of HPV increase your risk of cervical cancer. Testing for HPV may also be done on women of any age with unclear Pap test results.  Other health care providers may not recommend any screening for nonpregnant women who are considered low risk for pelvic cancer and who do not have symptoms. Ask your health care provider if a screening pelvic exam is right for you.  If you have had past treatment for cervical cancer or a condition that could lead to cancer, you need Pap tests and screening for cancer for at least 20 years after your treatment. If Pap tests have been discontinued, your risk factors (such as having a new sexual partner) need to be reassessed to determine if screening should resume. Some women have medical problems that increase the chance of getting cervical cancer. In these cases, your health care provider may recommend more frequent screening and Pap tests. Colorectal Cancer  This type of cancer can be detected and often prevented.  Routine colorectal cancer screening usually begins at 68 years of age and continues through 68 years of age.  Your health care provider may recommend screening at an earlier age if you have risk factors for colon cancer.  Your health care provider may also recommend using home test kits to check for  hidden blood in the stool.  A small camera at the end of a tube can be used to examine your colon directly (sigmoidoscopy or colonoscopy). This is done to check for the earliest forms of colorectal cancer.  Routine screening usually begins at age 60.  Direct examination of the colon should be repeated every 5-10 years through 68 years of age. However, you may need to be screened more often if early forms of precancerous polyps or small growths are found. Skin Cancer  Check your skin from head to toe regularly.  Tell your health care provider about any new moles or changes in moles, especially if there is a change in a mole's shape or color.  Also tell your health care provider if you have a mole that is larger than the size of a pencil eraser.  Always use sunscreen. Apply sunscreen liberally and repeatedly throughout the day.  Protect yourself by wearing long sleeves, pants, a wide-brimmed hat, and sunglasses whenever you are outside. Heart disease,  diabetes, and high blood pressure  High blood pressure causes heart disease and increases the risk of stroke. High blood pressure is more likely to develop in:  People who have blood pressure in the high end of the normal range (130-139/85-89 mm Hg).  People who are overweight or obese.  People who are African American.  If you are 31-23 years of age, have your blood pressure checked every 3-5 years. If you are 59 years of age or older, have your blood pressure checked every year. You should have your blood pressure measured twice-once when you are at a hospital or clinic, and once when you are not at a hospital or clinic. Record the average of the two measurements. To check your blood pressure when you are not at a hospital or clinic, you can use:  An automated blood pressure machine at a pharmacy.  A home blood pressure monitor.  If you are between 25 years and 24 years old, ask your health care provider if you should take aspirin to  prevent strokes.  Have regular diabetes screenings. This involves taking a blood sample to check your fasting blood sugar level.  If you are at a normal weight and have a low risk for diabetes, have this test once every three years after 68 years of age.  If you are overweight and have a high risk for diabetes, consider being tested at a younger age or more often. Preventing infection Hepatitis B  If you have a higher risk for hepatitis B, you should be screened for this virus. You are considered at high risk for hepatitis B if:  You were born in a country where hepatitis B is common. Ask your health care provider which countries are considered high risk.  Your parents were born in a high-risk country, and you have not been immunized against hepatitis B (hepatitis B vaccine).  You have HIV or AIDS.  You use needles to inject street drugs.  You live with someone who has hepatitis B.  You have had sex with someone who has hepatitis B.  You get hemodialysis treatment.  You take certain medicines for conditions, including cancer, organ transplantation, and autoimmune conditions. Hepatitis C  Blood testing is recommended for:  Everyone born from [birth year] through 1967.  Anyone with known risk factors for hepatitis C. Sexually transmitted infections (STIs)  You should be screened for sexually transmitted infections (STIs) including gonorrhea and chlamydia if:  You are sexually active and are younger than 68 years of age.  You are older than 68 years of age and your health care provider tells you that you are at risk for this type of infection.  Your sexual activity has changed since you were last screened and you are at an increased risk for chlamydia or gonorrhea. Ask your health care provider if you are at risk.  If you do not have HIV, but are at risk, it may be recommended that you take a prescription medicine daily to prevent HIV infection. This is called pre-exposure  prophylaxis (PrEP). You are considered at risk if:  You are sexually active and do not regularly use condoms or know the HIV status of your partner(s).  You take drugs by injection.  You are sexually active with a partner who has HIV. Talk with your health care provider about whether you are at high risk of being infected with HIV. If you choose to begin PrEP, you should first be tested for HIV. You should then be tested  every 3 months for as long as you are taking PrEP. Pregnancy  If you are premenopausal and you may become pregnant, ask your health care provider about preconception counseling.  If you may become pregnant, take 400 to 800 micrograms (mcg) of folic acid every day.  If you want to prevent pregnancy, talk to your health care provider about birth control (contraception). Osteoporosis and menopause  Osteoporosis is a disease in which the bones lose minerals and strength with aging. This can result in serious bone fractures. Your risk for osteoporosis can be identified using a bone density scan.  If you are 40 years of age or older, or if you are at risk for osteoporosis and fractures, ask your health care provider if you should be screened.  Ask your health care provider whether you should take a calcium or vitamin D supplement to lower your risk for osteoporosis.  Menopause may have certain physical symptoms and risks.  Hormone replacement therapy may reduce some of these symptoms and risks. Talk to your health care provider about whether hormone replacement therapy is right for you. Follow these instructions at home:  Schedule regular health, dental, and eye exams.  Stay current with your immunizations.  Do not use any tobacco products including cigarettes, chewing tobacco, or electronic cigarettes.  If you are pregnant, do not drink alcohol.  If you are breastfeeding, limit how much and how often you drink alcohol.  Limit alcohol intake to no more than 1 drink  per day for nonpregnant women. One drink equals 12 ounces of beer, 5 ounces of wine, or 1 ounces of hard liquor.  Do not use street drugs.  Do not share needles.  Ask your health care provider for help if you need support or information about quitting drugs.  Tell your health care provider if you often feel depressed.  Tell your health care provider if you have ever been abused or do not feel safe at home. This information is not intended to replace advice given to you by your health care provider. Make sure you discuss any questions you have with your health care provider. Document Released: 01/07/2011 Document Revised: 11/30/2015 Document Reviewed: 03/28/2015  2017 Elsevier

## 2016-08-01 NOTE — Assessment & Plan Note (Signed)
Given depo-medrol 40 mg IM at visit for her chronic allergies. She does taking otc as well.

## 2016-08-01 NOTE — Assessment & Plan Note (Signed)
Checking lipid panel for goal LDL <130. She has made lifestyle changes since last year.

## 2016-08-01 NOTE — Assessment & Plan Note (Signed)
Given pneumonia 23 at visit to complete pneumonia series. Flu shot up to date. Tetanus up to date. Colonoscopy done recently and needs 5 year follow up with Dr. Collene Mares. Counseled about sun safety and mole surveillance. Counseled about dangers of distracted driving. Given 10 year screening recommendations.

## 2016-08-01 NOTE — Progress Notes (Signed)
Pre visit review using our clinic review tool, if applicable. No additional management support is needed unless otherwise documented below in the visit note. 

## 2016-08-01 NOTE — Progress Notes (Signed)
   Subjective:    Patient ID: Jennifer Hopkins, female    DOB: [birth year], 66 y.o.   MRN: [id number]  HPI Here for medicare wellness and physical, no new complaints. Please see A/P for status and treatment of chronic medical problems. Had colonoscopy within the last year, needs repeat in 5 years (Dr. Collene Mares)   Diet: heart healthy Physical activity: active 5 days per week Depression/mood screen: negative Hearing: intact to whispered voice, mild loss Visual acuity: grossly normal, performs annual eye exam  ADLs: capable Fall risk: none Home safety: good Cognitive evaluation: intact to orientation, naming, recall and repetition EOL planning: adv directives discussed  I have personally reviewed and have noted 1. The patient's medical and social history - reviewed today no changes 2. Their use of alcohol, tobacco or illicit drugs 3. Their current medications and supplements 4. The patient's functional ability including ADL's, fall risks, home safety risks and hearing or visual impairment. 5. Diet and physical activities 6. Evidence for depression or mood disorders 7. Care team reviewed and updated (available in snapshot)  Review of Systems  Constitutional: Negative.   HENT: Negative.   Eyes: Negative.   Respiratory: Negative for cough, chest tightness and shortness of breath.   Cardiovascular: Negative for chest pain, palpitations and leg swelling.  Gastrointestinal: Negative for abdominal distention, abdominal pain, constipation, diarrhea, nausea and vomiting.  Musculoskeletal: Negative.   Skin: Negative.   Neurological: Negative.   Psychiatric/Behavioral: Negative.       Objective:   Physical Exam  Constitutional: She is oriented to person, place, and time. She appears well-developed and well-nourished.  HENT:  Head: Normocephalic and atraumatic.  Eyes: EOM are normal.  Neck: Normal range of motion.  Cardiovascular: Normal rate and regular rhythm.   Pulmonary/Chest:  Effort normal and breath sounds normal. No respiratory distress. She has no wheezes. She has no rales.  Abdominal: Soft. Bowel sounds are normal. She exhibits no distension. There is no tenderness. There is no rebound.  Musculoskeletal: She exhibits no edema.  Neurological: She is alert and oriented to person, place, and time. Coordination normal.  Skin: Skin is warm and dry.  Psychiatric: She has a normal mood and affect.   Vitals:   08/01/16 1107  BP: 130/86  Pulse: 92  Resp: 14  Temp: 98.1 F (36.7 C)  TempSrc: Oral  SpO2: 98%  Weight: 124 lb 4 oz (56.4 kg)  Height: 5\' 2"  (1.575 m)      Assessment & Plan:  Pneumonia 23 given at visit. Depo-medrol 40 mg IM given at visit.

## 2016-08-01 NOTE — Assessment & Plan Note (Signed)
Has lost 4 pounds since last year and BP at goal. She would like to continue with lifestyle changes to avoid medication.

## 2016-08-05 ENCOUNTER — Other Ambulatory Visit: Payer: Self-pay | Admitting: Internal Medicine

## 2016-08-05 MED ORDER — PRAVASTATIN SODIUM 40 MG PO TABS: 40.0000 mg | ORAL_TABLET | Freq: Every day | ORAL | 3 refills | Status: DC

## 2016-12-17 ENCOUNTER — Other Ambulatory Visit (INDEPENDENT_AMBULATORY_CARE_PROVIDER_SITE_OTHER): Payer: Medicare Other

## 2016-12-17 ENCOUNTER — Encounter: Payer: Self-pay | Admitting: Nurse Practitioner

## 2016-12-17 ENCOUNTER — Ambulatory Visit (INDEPENDENT_AMBULATORY_CARE_PROVIDER_SITE_OTHER): Payer: Medicare Other | Admitting: Nurse Practitioner

## 2016-12-17 ENCOUNTER — Ambulatory Visit: Payer: Medicare Other | Admitting: Internal Medicine

## 2016-12-17 VITALS — BP 140/88 | HR 84 | Temp 98.0°F | Ht 62.0 in | Wt 123.0 lb

## 2016-12-17 DIAGNOSIS — Z7189 Other specified counseling: Secondary | ICD-10-CM

## 2016-12-17 DIAGNOSIS — Z7184 Encounter for health counseling related to travel: Secondary | ICD-10-CM

## 2016-12-17 DIAGNOSIS — E785 Hyperlipidemia, unspecified: Secondary | ICD-10-CM | POA: Diagnosis not present

## 2016-12-17 LAB — LIPID PANEL
CHOL/HDL RATIO: 4
Cholesterol: 221 mg/dL — ABNORMAL HIGH (ref 0–200)
HDL: 50.7 mg/dL (ref >39.00)
LDL Cholesterol: 133 mg/dL — ABNORMAL HIGH (ref 0–99)
NONHDL: 170.47
Triglycerides: 186 mg/dL — ABNORMAL HIGH (ref 0.0–149.0)
VLDL: 37.2 mg/dL (ref 0.0–40.0)

## 2016-12-17 LAB — HEPATIC FUNCTION PANEL
ALBUMIN: 4.4 g/dL (ref 3.5–5.2)
ALK PHOS: 73 U/L (ref 39–117)
ALT: 12 U/L (ref 0–35)
AST: 19 U/L (ref 0–37)
BILIRUBIN DIRECT: 0.1 mg/dL (ref 0.0–0.3)
Total Bilirubin: 1.1 mg/dL (ref 0.2–1.2)
Total Protein: 7.6 g/dL (ref 6.0–8.3)

## 2016-12-17 MED ORDER — CIPROFLOXACIN HCL 250 MG PO TABS: 250.0000 mg | ORAL_TABLET | Freq: Two times a day (BID) | ORAL | 0 refills | Status: DC

## 2016-12-17 NOTE — Progress Notes (Signed)
Subjective:  Patient ID: Jennifer Hopkins, female    DOB: 07-23-1950  Age: 66 y.o. MRN: [id number]  CC: Follow-up (follow up on med--making sure it is workig,lipid and liver test?  cipro consult?for when go out of the county)   HPI Travel counsel: Has an upcoming trip to [place] 02/2017. Will like prescription for cipro for possible traveler's diarrhea.  Hyperlipidemia using flax oil, Co Q10 and pravastatin for hyperlipidemia. No change in diet. Walking daily x 58mins. Denies any adverse side effects with medications.  Outpatient Medications Prior to Visit  Medication Sig Dispense Refill  . calcium-vitamin D (OSCAL WITH D) 250-125 MG-UNIT per tablet Take 1 tablet by mouth daily.      . cholecalciferol (VITAMIN D) 1000 UNITS tablet Take 1,000 Units by mouth daily. Pt.  Not sure of dosage     . co-enzyme Q-10 30 MG capsule Take 30 mg by mouth 3 (three) times daily.    . diphenhydrAMINE (SOMINEX) 25 MG tablet Take 25 mg by mouth at bedtime as needed.    . fexofenadine (ALLEGRA) 30 MG tablet Take 30 mg by mouth daily.    . Flaxseed, Linseed, (FLAX SEED OIL PO) Take by mouth.    [name] ibuprofen (ADVIL,MOTRIN) 200 MG tablet Take 200 mg by mouth every 6 (six) hours as needed (pain).    . Multiple Vitamin (MULTIVITAMIN) tablet Take 1 tablet by mouth daily.      . pravastatin (PRAVACHOL) 40 MG tablet Take 1 tablet (40 mg total) by mouth daily. 90 tablet 3  . pseudoephedrine (SUDAFED) 30 MG tablet Take 30 mg by mouth every 4 (four) hours as needed. Reported on 08/02/2015    . vitamin C (VITAMIN C) 1000 MG tablet Take 1 tablet (1,000 mg total) by mouth daily. 120 tablet 0  . ciprofloxacin (CIPRO) 250 MG tablet Take 1 tablet (250 mg total) by mouth 2 (two) times daily. 10 tablet 0  . Omega-3 Fatty Acids (FISH OIL BURP-LESS) 1000 MG CAPS Take by mouth. Reported on 08/02/2015     No facility-administered medications prior to visit.     ROS See HPI  Objective:  BP 140/88   Pulse 84   Temp  98 F (36.7 C)   Ht 5\' 2"  (1.575 m)   Wt 123 lb (55.8 kg)   SpO2 98%   BMI 22.50 kg/m   BP Readings from Last 3 Encounters:  12/17/16 140/88  08/01/16 130/86  03/14/16 (!) 154/91    Wt Readings from Last 3 Encounters:  12/17/16 123 lb (55.8 kg)  08/01/16 124 lb 4 oz (56.4 kg)  03/14/16 128 lb (58.1 kg)    Physical Exam  Constitutional: She is oriented to person, place, and time. No distress.  Cardiovascular: Normal rate, regular rhythm and normal heart sounds.   Pulmonary/Chest: Effort normal and breath sounds normal.  Abdominal: Soft. Bowel sounds are normal. She exhibits no distension and no mass. There is no tenderness.  Neurological: She is alert and oriented to person, place, and time.  Skin: Skin is warm and dry.  Vitals reviewed.   Lab Results  Component Value Date   WBC 7.3 08/01/2016   HGB 13.6 08/01/2016   HCT 39.6 08/01/2016   PLT 361.0 08/01/2016   GLUCOSE 97 08/01/2016   CHOL 221 (H) 12/17/2016   TRIG 186.0 (H) 12/17/2016   HDL 50.70 12/17/2016   LDLCALC 133 (H) 12/17/2016   ALT 12 12/17/2016   AST 19 12/17/2016   NA 139 08/01/2016  K 3.6 08/01/2016   CL 103 08/01/2016   CREATININE 0.71 08/01/2016   BUN 9 08/01/2016   CO2 32 08/01/2016   TSH 1.26 01/30/2015    Mm Screening Breast Tomo Bilateral  Result Date: 02/19/2016 CLINICAL DATA:  Screening. Prior malignant lumpectomy of the right breast in 2002 with adjuvant radiation therapy. Prior left axillary node excision for metastatic skin cancer in 2011. EXAM: 2D DIGITAL SCREENING BILATERAL MAMMOGRAM WITH CAD AND ADJUNCT TOMO COMPARISON:  Previous exam(s). ACR Breast Density Category c: The breast tissue is heterogeneously dense, which may obscure small masses. FINDINGS: There are no findings suspicious for malignancy. Stable post lumpectomy changes in the right breast. Images were processed with CAD. IMPRESSION: No mammographic evidence of malignancy. A result letter of this screening mammogram will be  mailed directly to the patient. RECOMMENDATION: Screening mammogram in one year. (Code:SM-B-01Y) BI-RADS CATEGORY  2: Benign. Electronically Signed   By: Evangeline Dakin M.D.   On: 02/20/2016 16:28    Assessment & Plan:   Jennifer Hopkins was seen today for follow-up.  Diagnoses and all orders for this visit:  Hyperlipidemia, unspecified hyperlipidemia type -     Hepatic function panel; Future -     Lipid panel; Future  Encounter for counseling for travel -     ciprofloxacin (CIPRO) 250 MG tablet; Take 1 tablet (250 mg total) by mouth 2 (two) times daily.   I am having [name] maintain her calcium-vitamin D, multivitamin, pseudoephedrine, cholecalciferol, diphenhydrAMINE, fexofenadine, ibuprofen, ascorbic acid, FISH OIL BURP-LESS, co-enzyme Q-10, (Flaxseed, Linseed, (FLAX SEED OIL PO)), pravastatin, and ciprofloxacin.  Meds ordered this encounter  Medications  . ciprofloxacin (CIPRO) 250 MG tablet    Sig: Take 1 tablet (250 mg total) by mouth 2 (two) times daily.    Dispense:  10 tablet    Refill:  0    Order Specific Question:   Supervising Provider    Answer:   Cassandria Anger [[id number]]    Follow-up: Return in about 6 months (around 06/18/2017) for hyperlipidemia.  Wilfred Lacy, NP

## 2016-12-17 NOTE — Patient Instructions (Signed)

## 2017-02-19 ENCOUNTER — Ambulatory Visit
Admission: RE | Admit: 2017-02-19 | Discharge: 2017-02-19 | Disposition: A | Discharge status: Home / Self Care | Payer: Medicare Other | Source: Ambulatory Visit | Attending: Adult Health | Admitting: Adult Health

## 2017-02-19 DIAGNOSIS — Z1231 Encounter for screening mammogram for malignant neoplasm of breast: Secondary | ICD-10-CM | POA: Diagnosis not present

## 2017-03-14 ENCOUNTER — Ambulatory Visit (HOSPITAL_BASED_OUTPATIENT_CLINIC_OR_DEPARTMENT_OTHER): Payer: Medicare Other | Admitting: Hematology and Oncology

## 2017-03-14 DIAGNOSIS — C50311 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: Secondary | ICD-10-CM

## 2017-03-14 DIAGNOSIS — Z170 Estrogen receptor positive status [ER+]: Secondary | ICD-10-CM

## 2017-03-14 NOTE — Assessment & Plan Note (Signed)
Right breast cancer diagnosed in 2002 and treated with neoadjuvant chemotherapy followed by surgery followed by additional adjuvant chemotherapy followed by radiation and antiestrogen therapy that was completed in 2007.  Breast Cancer Surveillance: 1. Breast exam 03/14/2017: Normal 2. Mammogram 02/19/2017 No abnormalities. Postsurgical changes. Breast Density Category C.   Patient loves to travel Return to clinic in 1 year to follow with survivorship nurse practitioner

## 2017-03-14 NOTE — Progress Notes (Signed)
 Patient Care Team: Crawford, Elizabeth A, MD as PCP - General (Internal Medicine)  DIAGNOSIS:  Encounter Diagnosis  Name Primary?  . 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, unspecified estrogen receptor status (HCC)     SUMMARY OF ONCOLOGIC HISTORY:   Breast cancer, right breast (HCC)   10/16/2000 Initial Diagnosis    Mammogram revealed mass in the right breast biopsy showed IDC with DCIS ER 90% PR 81% Ki-67 6% HER-2 negative      11/10/2000 Surgery    Sentinel lymph node study for lymph nodes negative      12/08/2000 - 01/28/2001 Neo-Adjuvant Chemotherapy    4 cycles of Adriamycin and Taxotere      02/06/2001 Surgery    Lumpectomy showed residual IMC and DCIS 1.5 cm      03/10/2001 - 04/20/2001 Chemotherapy    [facility] chemotherapy       Radiation Therapy    Radiation therapy to the right breast      05/10/2001 - 05/10/2006 Anti-estrogen oral therapy    Tamoxifen 20 mg daily for 5 years      01/09/2010 Relapse/Recurrence    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 Kinard       CHIEF COMPLIANT: Annual surveillance of breast cancer   INTERVAL HISTORY: Jennifer Hopkins is a 66-year-old with above-mentioned history of from right breast cancer was treated with the neoadjuvant chemotherapy followed by lumpectomy and radiation. She completed her breast cancer therapy but 2007 after completing 5 years of tamoxifen. In 2011 she had a recurrence in the left axilla where a basal squamous carcinoma was identified. She had left axillary lymph node dissection and followed by radiation therapy. She is currently on surveillance. She reports no new problems or concerns. Over the course of  several years [place] She is planning on going to [place] and shortly there after she plans were to [place] and [place]  REVIEW OF SYSTEMS:   Constitutional: Denies fevers, chills or abnormal weight loss Eyes: Denies blurriness of vision Ears, nose, mouth, throat, and face: Denies mucositis or sore throat Respiratory: Denies cough, dyspnea or wheezes Cardiovascular: Denies palpitation, chest discomfort Gastrointestinal:  Denies nausea, heartburn or change in bowel habits Skin: Denies abnormal skin rashes Lymphatics: Denies new lymphadenopathy or easy bruising Neurological:Denies numbness, tingling or new weaknesses Behavioral/Psych: Mood is stable, no new changes  Extremities: No lower extremity edema  All other systems were reviewed with the patient and are negative.  I have reviewed the past medical history, past surgical history, social history and family history with the patient and they are unchanged from previous note.  ALLERGIES:  has No Known Allergies.  MEDICATIONS:  Current Outpatient Prescriptions  Medication Sig Dispense Refill  . calcium-vitamin D (OSCAL WITH D) 250-125 MG-UNIT per tablet Take 1 tablet by mouth daily.      . cholecalciferol (VITAMIN D) 1000 UNITS tablet Take 1,000 Units by mouth daily. Pt.  Not sure of dosage     . ciprofloxacin (CIPRO) 250 MG tablet Take 1 tablet (250 mg total) by mouth 2 (two) times daily. 10 tablet 0  . co-enzyme Q-10 30   MG capsule Take 30 mg by mouth 3 (three) times daily.    . diphenhydrAMINE (SOMINEX) 25 MG tablet Take 25 mg by mouth at bedtime as needed.    . fexofenadine (ALLEGRA) 30 MG tablet Take 30 mg by mouth daily.    . Flaxseed, Linseed, (FLAX SEED OIL PO) Take by mouth.    [name] ibuprofen (ADVIL,MOTRIN) 200 MG tablet Take 200 mg by mouth every 6 (six) hours as needed (pain).    . Multiple Vitamin (MULTIVITAMIN) tablet Take 1 tablet by mouth daily.      . Omega-3 Fatty Acids  (FISH OIL BURP-LESS) 1000 MG CAPS Take by mouth. Reported on 08/02/2015    . pravastatin (PRAVACHOL) 40 MG tablet Take 1 tablet (40 mg total) by mouth daily. 90 tablet 3  . pseudoephedrine (SUDAFED) 30 MG tablet Take 30 mg by mouth every 4 (four) hours as needed. Reported on 08/02/2015    . vitamin C (VITAMIN C) 1000 MG tablet Take 1 tablet (1,000 mg total) by mouth daily. 120 tablet 0   No current facility-administered medications for this visit.     PHYSICAL EXAMINATION: ECOG PERFORMANCE STATUS: 1 - Symptomatic but completely ambulatory  Vitals:   03/14/17 0858  BP: (!) 144/95  Pulse: 85  Resp: 18  Temp: 97.8 F (36.6 C)  SpO2: 97%   Filed Weights   03/14/17 0858  Weight: 125 lb 8 oz (56.9 kg)    GENERAL:alert, no distress and comfortable SKIN: skin color, texture, turgor are normal, no rashes or significant lesions EYES: normal, Conjunctiva are pink and non-injected, sclera clear OROPHARYNX:no exudate, no erythema and lips, buccal mucosa, and tongue normal  NECK: supple, thyroid normal size, non-tender, without nodularity LYMPH:  no palpable lymphadenopathy in the cervical, axillary or inguinal LUNGS: clear to auscultation and percussion with normal breathing effort HEART: regular rate & rhythm and no murmurs and no lower extremity edema ABDOMEN:abdomen soft, non-tender and normal bowel sounds MUSCULOSKELETAL:no cyanosis of digits and no clubbing  NEURO: alert & oriented x 3 with fluent speech, no focal motor/sensory deficits EXTREMITIES: No lower extremity edema BREAST: No palpable masses or nodules in either right or left breasts. No palpable axillary supraclavicular or infraclavicular adenopathy no breast tenderness or nipple discharge. (exam performed in the presence of a chaperone)  LABORATORY DATA:  I have reviewed the data as listed   Chemistry      Component Value Date/Time   NA 139 08/01/2016 1200   NA 141 03/10/2014 1536   K 3.6 08/01/2016 1200   K 3.3 (L)  03/10/2014 1536   CL 103 08/01/2016 1200   CL 105 09/29/2012 1000   CO2 32 08/01/2016 1200   CO2 26 03/10/2014 1536   BUN 9 08/01/2016 1200   BUN 10.6 03/10/2014 1536   CREATININE 0.71 08/01/2016 1200   CREATININE 0.9 03/10/2014 1536      Component Value Date/Time   CALCIUM 9.6 08/01/2016 1200   CALCIUM 9.3 03/10/2014 1536   ALKPHOS 73 12/17/2016 1048   ALKPHOS 92 03/10/2014 1536   AST 19 12/17/2016 1048   AST 18 03/10/2014 1536   ALT 12 12/17/2016 1048   ALT 14 03/10/2014 1536   BILITOT 1.1 12/17/2016 1048   BILITOT 0.66 03/10/2014 1536       Lab Results  Component Value Date   WBC 7.3 08/01/2016   HGB 13.6 08/01/2016   HCT 39.6 08/01/2016   MCV 87.3 08/01/2016   PLT 361.0 08/01/2016   NEUTROABS 6.0 03/10/2014  ASSESSMENT & PLAN:  Breast cancer, right breast Right breast cancer diagnosed in 2002 and treated with neoadjuvant chemotherapy followed by surgery followed by additional adjuvant chemotherapy followed by radiation and antiestrogen therapy that was completed in 2007.  Breast Cancer Surveillance: 1. Breast exam 03/14/2017: Normal 2. Mammogram 02/19/2017 No abnormalities. Postsurgical changes. Breast Density Category C.   Patient loves to travel Return to clinic in 1 year to follow with survivorship nurse practitioner   I spent 25 minutes talking to the patient of which more than half was spent in counseling and coordination of care.  No orders of the defined types were placed in this encounter.  The patient has a good understanding of the overall plan. she agrees with it. she will call with any problems that may develop before the next visit here.   ,  K, MD 03/14/17    

## 2017-03-31 DIAGNOSIS — Z23 Encounter for immunization: Secondary | ICD-10-CM | POA: Diagnosis not present

## 2017-08-19 ENCOUNTER — Ambulatory Visit (INDEPENDENT_AMBULATORY_CARE_PROVIDER_SITE_OTHER)
Admission: RE | Admit: 2017-08-19 | Discharge: 2017-08-19 | Disposition: A | Discharge status: Home / Self Care | Payer: Medicare Other | Source: Ambulatory Visit | Attending: Internal Medicine | Admitting: Internal Medicine

## 2017-08-19 ENCOUNTER — Ambulatory Visit (INDEPENDENT_AMBULATORY_CARE_PROVIDER_SITE_OTHER): Payer: Medicare Other | Admitting: Internal Medicine

## 2017-08-19 ENCOUNTER — Other Ambulatory Visit (INDEPENDENT_AMBULATORY_CARE_PROVIDER_SITE_OTHER): Payer: Medicare Other

## 2017-08-19 ENCOUNTER — Encounter: Payer: Self-pay | Admitting: Internal Medicine

## 2017-08-19 VITALS — BP 118/78 | HR 88 | Temp 97.6°F | Ht 62.0 in | Wt 125.0 lb

## 2017-08-19 DIAGNOSIS — E2839 Other primary ovarian failure: Secondary | ICD-10-CM

## 2017-08-19 DIAGNOSIS — E785 Hyperlipidemia, unspecified: Secondary | ICD-10-CM

## 2017-08-19 DIAGNOSIS — Z0000 Encounter for general adult medical examination without abnormal findings: Secondary | ICD-10-CM

## 2017-08-19 DIAGNOSIS — Z7184 Encounter for health counseling related to travel: Secondary | ICD-10-CM

## 2017-08-19 LAB — LIPID PANEL
Cholesterol: 207 mg/dL — ABNORMAL HIGH (ref 0–200)
HDL: 56.2 mg/dL (ref >39.00)
LDL Cholesterol: 132 mg/dL — ABNORMAL HIGH (ref 0–99)
NonHDL: 151.07
TRIGLYCERIDES: 93 mg/dL (ref 0.0–149.0)
Total CHOL/HDL Ratio: 4
VLDL: 18.6 mg/dL (ref 0.0–40.0)

## 2017-08-19 LAB — COMPREHENSIVE METABOLIC PANEL
ALBUMIN: 4.2 g/dL (ref 3.5–5.2)
ALT: 20 U/L (ref 0–35)
AST: 24 U/L (ref 0–37)
Alkaline Phosphatase: 76 U/L (ref 39–117)
BILIRUBIN TOTAL: 1.3 mg/dL — AB (ref 0.2–1.2)
BUN: 9 mg/dL (ref 6–23)
CALCIUM: 9.2 mg/dL (ref 8.4–10.5)
CO2: 31 meq/L (ref 19–32)
CREATININE: 0.69 mg/dL (ref 0.40–1.20)
Chloride: 101 mEq/L (ref 96–112)
GFR: 89.66 mL/min (ref >60.00)
Glucose, Bld: 94 mg/dL (ref 70–99)
Potassium: 3.3 mEq/L — ABNORMAL LOW (ref 3.5–5.1)
SODIUM: 139 meq/L (ref 135–145)
Total Protein: 7.4 g/dL (ref 6.0–8.3)

## 2017-08-19 LAB — CBC
HCT: 38 % (ref 36.0–46.0)
Hemoglobin: 12.9 g/dL (ref 12.0–15.0)
MCHC: 33.9 g/dL (ref 30.0–36.0)
MCV: 89.1 fl (ref 78.0–100.0)
Platelets: 336 10*3/uL (ref 150.0–400.0)
RBC: 4.27 Mil/uL (ref 3.87–5.11)
RDW: 13.1 % (ref 11.5–15.5)
WBC: 5.6 10*3/uL (ref 4.0–10.5)

## 2017-08-19 MED ORDER — CIPROFLOXACIN HCL 250 MG PO TABS: 250.0000 mg | ORAL_TABLET | Freq: Two times a day (BID) | ORAL | 0 refills | Status: DC

## 2017-08-19 MED ORDER — ZOSTER VAC RECOMB ADJUVANTED 50 MCG/0.5ML IM SUSR: 0.5000 mL | Freq: Once | INTRAMUSCULAR | 1 refills | Status: AC

## 2017-08-19 NOTE — Patient Instructions (Signed)
We will check the labs today and call you back.   Health Maintenance, Female Adopting a healthy lifestyle and getting preventive care can go a long way to promote health and wellness. Talk with your health care provider about what schedule of regular examinations is right for you. This is a good chance for you to check in with your provider about disease prevention and staying healthy. In between checkups, there are plenty of things you can do on your own. Experts have done a lot of research about which lifestyle changes and preventive measures are most likely to keep you healthy. Ask your health care provider for more information. Weight and diet Eat a healthy diet  Be sure to include plenty of vegetables, fruits, low-fat dairy products, and lean protein.  Do not eat a lot of foods high in solid fats, added sugars, or salt.  Get regular exercise. This is one of the most important things you can do for your health. ? Most adults should exercise for at least 150 minutes each week. The exercise should increase your heart rate and make you sweat (moderate-intensity exercise). ? Most adults should also do strengthening exercises at least twice a week. This is in addition to the moderate-intensity exercise.  Maintain a healthy weight  Body mass index (BMI) is a measurement that can be used to identify possible weight problems. It estimates body fat based on height and weight. Your health care provider can help determine your BMI and help you achieve or maintain a healthy weight.  For females 57 years of age and older: ? A BMI below 18.5 is considered underweight. ? A BMI of 18.5 to 24.9 is normal. ? A BMI of 25 to 29.9 is considered overweight. ? A BMI of 30 and above is considered obese.  Watch levels of cholesterol and blood lipids  You should start having your blood tested for lipids and cholesterol at 69 years of age, then have this test every 5 years.  You may need to have your  cholesterol levels checked more often if: ? Your lipid or cholesterol levels are high. ? You are older than 69 years of age. ? You are at high risk for heart disease.  Cancer screening Lung Cancer  Lung cancer screening is recommended for adults 62-32 years old who are at high risk for lung cancer because of a history of smoking.  A yearly low-dose CT scan of the lungs is recommended for people who: ? Currently smoke. ? Have quit within the past 15 years. ? Have at least a 30-pack-year history of smoking. A pack year is smoking an average of one pack of cigarettes a day for 1 year.  Yearly screening should continue until it has been 15 years since you quit.  Yearly screening should stop if you develop a health problem that would prevent you from having lung cancer treatment.  Breast Cancer  Practice breast self-awareness. This means understanding how your breasts normally appear and feel.  It also means doing regular breast self-exams. Let your health care provider know about any changes, no matter how small.  If you are in your 20s or 30s, you should have a clinical breast exam (CBE) by a health care provider every 1-3 years as part of a regular health exam.  If you are 53 or older, have a CBE every year. Also consider having a breast X-ray (mammogram) every year.  If you have a family history of breast cancer, talk to your health  provider about genetic screening.  If you are at high risk for breast cancer, talk to your health care provider about having an MRI and a mammogram every year.  Breast cancer gene (BRCA) assessment is recommended for women who have family members with BRCA-related cancers. BRCA-related cancers include: ? Breast. ? Ovarian. ? Tubal. ? Peritoneal cancers.  Results of the assessment will determine the need for genetic counseling and BRCA1 and BRCA2 testing.  Cervical Cancer Your health care provider may recommend that you be screened regularly for cancer of  the pelvic organs (ovaries, uterus, and vagina). This screening involves a pelvic examination, including checking for microscopic changes to the surface of your cervix (Pap test). You may be encouraged to have this screening done every 3 years, beginning at age 21.  For women ages 30-65, health care providers may recommend pelvic exams and Pap testing every 3 years, or they may recommend the Pap and pelvic exam, combined with testing for human papilloma virus (HPV), every 5 years. Some types of HPV increase your risk of cervical cancer. Testing for HPV may also be done on women of any age with unclear Pap test results.  Other health care providers may not recommend any screening for nonpregnant women who are considered low risk for pelvic cancer and who do not have symptoms. Ask your health care provider if a screening pelvic exam is right for you.  If you have had past treatment for cervical cancer or a condition that could lead to cancer, you need Pap tests and screening for cancer for at least 20 years after your treatment. If Pap tests have been discontinued, your risk factors (such as having a new sexual partner) need to be reassessed to determine if screening should resume. Some women have medical problems that increase the chance of getting cervical cancer. In these cases, your health care provider may recommend more frequent screening and Pap tests.  Colorectal Cancer  This type of cancer can be detected and often prevented.  Routine colorectal cancer screening usually begins at 69 years of age and continues through 69 years of age.  Your health care provider may recommend screening at an earlier age if you have risk factors for colon cancer.  Your health care provider may also recommend using home test kits to check for hidden blood in the stool.  A small camera at the end of a tube can be used to examine your colon directly (sigmoidoscopy or colonoscopy). This is done to check for the  earliest forms of colorectal cancer.  Routine screening usually begins at age 50.  Direct examination of the colon should be repeated every 5-10 years through 69 years of age. However, you may need to be screened more often if early forms of precancerous polyps or small growths are found.  Skin Cancer  Check your skin from head to toe regularly.  Tell your health care provider about any new moles or changes in moles, especially if there is a change in a mole's shape or color.  Also tell your health care provider if you have a mole that is larger than the size of a pencil eraser.  Always use sunscreen. Apply sunscreen liberally and repeatedly throughout the day.  Protect yourself by wearing long sleeves, pants, a wide-brimmed hat, and sunglasses whenever you are outside.  Heart disease, diabetes, and high blood pressure  High blood pressure causes heart disease and increases the risk of stroke. High blood pressure is more likely to develop in: ?   People who have blood pressure in the high end of the normal range (130-139/85-89 mm Hg). ? People who are overweight or obese. ? People who are African American.  If you are 18-39 years of age, have your blood pressure checked every 3-5 years. If you are 40 years of age or older, have your blood pressure checked every year. You should have your blood pressure measured twice-once when you are at a hospital or clinic, and once when you are not at a hospital or clinic. Record the average of the two measurements. To check your blood pressure when you are not at a hospital or clinic, you can use: ? An automated blood pressure machine at a pharmacy. ? A home blood pressure monitor.  If you are between 55 years and 79 years old, ask your health care provider if you should take aspirin to prevent strokes.  Have regular diabetes screenings. This involves taking a blood sample to check your fasting blood sugar level. ? If you are at a normal weight and  have a low risk for diabetes, have this test once every three years after 69 years of age. ? If you are overweight and have a high risk for diabetes, consider being tested at a younger age or more often. Preventing infection Hepatitis B  If you have a higher risk for hepatitis B, you should be screened for this virus. You are considered at high risk for hepatitis B if: ? You were born in a country where hepatitis B is common. Ask your health care provider which countries are considered high risk. ? Your parents were born in a high-risk country, and you have not been immunized against hepatitis B (hepatitis B vaccine). ? You have HIV or AIDS. ? You use needles to inject street drugs. ? You live with someone who has hepatitis B. ? You have had sex with someone who has hepatitis B. ? You get hemodialysis treatment. ? You take certain medicines for conditions, including cancer, organ transplantation, and autoimmune conditions.  Hepatitis C  Blood testing is recommended for: ? Everyone born from 1945 through 1965. ? Anyone with known risk factors for hepatitis C.  Sexually transmitted infections (STIs)  You should be screened for sexually transmitted infections (STIs) including gonorrhea and chlamydia if: ? You are sexually active and are younger than 69 years of age. ? You are older than 69 years of age and your health care provider tells you that you are at risk for this type of infection. ? Your sexual activity has changed since you were last screened and you are at an increased risk for chlamydia or gonorrhea. Ask your health care provider if you are at risk.  If you do not have HIV, but are at risk, it may be recommended that you take a prescription medicine daily to prevent HIV infection. This is called pre-exposure prophylaxis (PrEP). You are considered at risk if: ? You are sexually active and do not regularly use condoms or know the HIV status of your partner(s). ? You take drugs by  injection. ? You are sexually active with a partner who has HIV.  Talk with your health care provider about whether you are at high risk of being infected with HIV. If you choose to begin PrEP, you should first be tested for HIV. You should then be tested every 3 months for as long as you are taking PrEP. Pregnancy  If you are premenopausal and you may become pregnant, ask your health   your health care provider about preconception counseling.  If you may become pregnant, take 400 to 800 micrograms (mcg) of folic acid every day.  If you want to prevent pregnancy, talk to your health care provider about birth control (contraception). Osteoporosis and menopause  Osteoporosis is a disease in which the bones lose minerals and strength with aging. This can result in serious bone fractures. Your risk for osteoporosis can be identified using a bone density scan.  If you are 13 years of age or older, or if you are at risk for osteoporosis and fractures, ask your health care provider if you should be screened.  Ask your health care provider whether you should take a calcium or vitamin D supplement to lower your risk for osteoporosis.  Menopause may have certain physical symptoms and risks.  Hormone replacement therapy may reduce some of these symptoms and risks. Talk to your health care provider about whether hormone replacement therapy is right for you. Follow these instructions at home:  Schedule regular health, dental, and eye exams.  Stay current with your immunizations.  Do not use any tobacco products including cigarettes, chewing tobacco, or electronic cigarettes.  If you are pregnant, do not drink alcohol.  If you are breastfeeding, limit how much and how often you drink alcohol.  Limit alcohol intake to no more than 1 drink per day for nonpregnant women. One drink equals 12 ounces of beer, 5 ounces of wine, or 1 ounces of hard liquor.  Do not use street  drugs.  Do not share needles.  Ask your health care provider for help if you need support or information about quitting drugs.  Tell your health care provider if you often feel depressed.  Tell your health care provider if you have ever been abused or do not feel safe at home. This information is not intended to replace advice given to you by your health care provider. Make sure you discuss any questions you have with your health care provider. Document Released: 01/07/2011 Document Revised: 11/30/2015 Document Reviewed: 03/28/2015 Elsevier Interactive Patient Education  [employer].

## 2017-08-19 NOTE — Progress Notes (Signed)
   Subjective:    Patient ID: Jennifer Hopkins, female    DOB: Oct 06, 1950, 67 y.o.   MRN: [id number]  HPI Here for medicare wellness and physical, no new complaints. Please see A/P for status and treatment of chronic medical problems.   Colonoscopy with Dr. Collene Mares in the last 2 years.   Diet: heart healthy Physical activity: active Depression/mood screen: negative Hearing: intact to whispered voice Visual acuity: grossly normal with lens, performs annual eye exam  ADLs: capable Fall risk: none Home safety: good Cognitive evaluation: intact to orientation, naming, recall and repetition EOL planning: adv directives discussed, in place  I have personally reviewed and have noted 1. The patient's medical and social history - reviewed today no changes 2. Their use of alcohol, tobacco or illicit drugs 3. Their current medications and supplements 4. The patient's functional ability including ADL's, fall risks, home safety risks and hearing or visual impairment. 5. Diet and physical activities 6. Evidence for depression or mood disorders 7. Care team reviewed and updated (available in snapshot)  Review of Systems  Constitutional: Negative.   HENT: Negative.   Eyes: Negative.   Respiratory: Negative for cough, chest tightness and shortness of breath.   Cardiovascular: Negative for chest pain, palpitations and leg swelling.  Gastrointestinal: Negative for abdominal distention, abdominal pain, constipation, diarrhea, nausea and vomiting.  Musculoskeletal: Negative.   Skin: Negative.   Neurological: Negative.   Psychiatric/Behavioral: Negative.       Objective:   Physical Exam  Constitutional: She is oriented to person, place, and time. She appears well-developed and well-nourished.  HENT:  Head: Normocephalic and atraumatic.  Eyes: EOM are normal.  Neck: Normal range of motion.  Cardiovascular: Normal rate and regular rhythm.  Pulmonary/Chest: Effort normal and breath sounds  normal. No respiratory distress. She has no wheezes. She has no rales.  Abdominal: Soft. Bowel sounds are normal. She exhibits no distension. There is no tenderness. There is no rebound.  Musculoskeletal: She exhibits no edema.  Neurological: She is alert and oriented to person, place, and time. Coordination normal.  Skin: Skin is warm and dry.  Psychiatric: She has a normal mood and affect.   Vitals:   08/19/17 0846  BP: 118/78  Pulse: 88  Temp: 97.6 F (36.4 C)  TempSrc: Oral  SpO2: 95%  Weight: 125 lb (56.7 kg)  Height: 5\' 2"  (1.575 m)      Assessment & Plan:

## 2017-08-20 NOTE — Assessment & Plan Note (Signed)
Checking lipid panel and adjust as needed. Taking fish oil and pravastatin.

## 2017-08-20 NOTE — Assessment & Plan Note (Signed)
Colonoscopy up to date but we do not have records and she signed today to get those. Mammogram up to date and pap smear up to date. Flu and tetanus and pneumonia up to date. Counseled about shingrix and given rx. Given 10 year screening recommendations.

## 2017-08-21 DIAGNOSIS — E2839 Other primary ovarian failure: Secondary | ICD-10-CM | POA: Diagnosis not present

## 2017-08-22 ENCOUNTER — Other Ambulatory Visit: Payer: Self-pay | Admitting: Internal Medicine

## 2017-08-22 MED ORDER — ALENDRONATE SODIUM 70 MG PO TABS: 70.0000 mg | ORAL_TABLET | ORAL | 3 refills | Status: DC

## 2017-12-30 DIAGNOSIS — L821 Other seborrheic keratosis: Secondary | ICD-10-CM | POA: Diagnosis not present

## 2018-01-09 ENCOUNTER — Other Ambulatory Visit: Payer: Self-pay | Admitting: Adult Health

## 2018-01-09 ENCOUNTER — Other Ambulatory Visit: Payer: Self-pay | Admitting: Hematology and Oncology

## 2018-01-09 DIAGNOSIS — Z1231 Encounter for screening mammogram for malignant neoplasm of breast: Secondary | ICD-10-CM

## 2018-02-20 ENCOUNTER — Ambulatory Visit
Admission: RE | Admit: 2018-02-20 | Discharge: 2018-02-20 | Disposition: A | Discharge status: Home / Self Care | Payer: Medicare Other | Source: Ambulatory Visit | Attending: Hematology and Oncology | Admitting: Hematology and Oncology

## 2018-02-20 DIAGNOSIS — Z1231 Encounter for screening mammogram for malignant neoplasm of breast: Secondary | ICD-10-CM

## 2018-03-16 ENCOUNTER — Telehealth: Payer: Self-pay | Admitting: Hematology and Oncology

## 2018-03-16 ENCOUNTER — Inpatient Hospital Stay: Payer: Medicare Other | Attending: Hematology and Oncology | Admitting: Hematology and Oncology

## 2018-03-16 DIAGNOSIS — Z7184 Encounter for health counseling related to travel: Secondary | ICD-10-CM

## 2018-03-16 DIAGNOSIS — C50311 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: Secondary | ICD-10-CM

## 2018-03-16 DIAGNOSIS — Z853 Personal history of malignant neoplasm of breast: Secondary | ICD-10-CM

## 2018-03-16 DIAGNOSIS — Z23 Encounter for immunization: Secondary | ICD-10-CM | POA: Diagnosis not present

## 2018-03-16 MED ORDER — CIPROFLOXACIN HCL 250 MG PO TABS: 250.0000 mg | ORAL_TABLET | Freq: Two times a day (BID) | ORAL | 0 refills | Status: DC

## 2018-03-16 NOTE — Telephone Encounter (Signed)
Gave avs and calendar ° °

## 2018-03-16 NOTE — Assessment & Plan Note (Signed)
Right breast cancer diagnosed in 2002 and treated with neoadjuvant chemotherapy followed by surgery followed by additional adjuvant chemotherapy followed by radiation and antiestrogen therapy that was completed in 2007.  Breast Cancer Surveillance: 1. Breast exam  03/16/2018: Normal 2. Mammogram  02/20/2018 No abnormalities. Postsurgical changes. Breast Density Category C.   Patient loves to travel Return to clinic in 1 year to follow with survivorship nurse practitioner

## 2018-03-16 NOTE — Progress Notes (Signed)
 Patient Care Team: Crawford, Elizabeth A, MD as PCP - General (Internal Medicine)  DIAGNOSIS:  Encounter Diagnosis  Name Primary?  . 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, unspecified estrogen receptor status (HCC)     SUMMARY OF ONCOLOGIC HISTORY:   Breast cancer, right breast (HCC)   10/16/2000 Initial Diagnosis    Mammogram revealed mass in the right breast biopsy showed IDC with DCIS ER 90% PR 81% Ki-67 6% HER-2 negative    11/10/2000 Surgery    Sentinel lymph node study for lymph nodes negative    12/08/2000 - 01/28/2001 Neo-Adjuvant Chemotherapy    4 cycles of Adriamycin and Taxotere    02/06/2001 Surgery    Lumpectomy showed residual IMC and DCIS 1.5 cm    03/10/2001 - 04/20/2001 Chemotherapy    [facility] chemotherapy     Radiation Therapy    Radiation therapy to the right breast    05/10/2001 - 05/10/2006 Anti-estrogen oral therapy    Tamoxifen 20 mg daily for 5 years    01/09/2010 Relapse/Recurrence    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 Kinard     CHIEF COMPLIANT: Surveillance of breast cancer  INTERVAL HISTORY: Jennifer Hopkins is a 67-year with above-mentioned history of left breast cancer who is currently in surveillance.  She denies any lumps or nodules in the breast or axilla.  She has been traveling the world including [place], [place] and she plans to go to [place] and [place] in next couple weeks.  REVIEW OF SYSTEMS:   Constitutional: Denies fevers, chills or abnormal weight loss Eyes: Denies blurriness of vision Ears, nose, mouth, throat, and face: Denies mucositis or sore throat Respiratory: Denies cough, dyspnea or wheezes Cardiovascular: Denies  palpitation, chest discomfort Gastrointestinal:  Denies nausea, heartburn or change in bowel habits Skin: Denies abnormal skin rashes Lymphatics: Denies new lymphadenopathy or easy bruising Neurological:Denies numbness, tingling or new weaknesses Behavioral/Psych: Mood is stable, no new changes  Extremities: No lower extremity edema Breast:  denies any pain or lumps or nodules in either breasts All other systems were reviewed with the patient and are negative.  I have reviewed the past medical history, past surgical history, social history and family history with the patient and they are unchanged from previous note.  ALLERGIES:  has No Known Allergies.  MEDICATIONS:  Current Outpatient Medications  Medication Sig Dispense Refill  . alendronate (FOSAMAX) 70 MG tablet Take 1 tablet (70 mg total) by mouth every 7 (seven) days. Take with a full glass of water on an empty stomach. 12 tablet 3  . calcium-vitamin D (OSCAL WITH D) 250-125 MG-UNIT per tablet Take 1 tablet by mouth daily.      . cholecalciferol (VITAMIN D) 1000 UNITS tablet Take 1,000 Units by mouth daily. Pt.  Not sure of dosage     . ciprofloxacin (CIPRO) 250 MG tablet Take 1 tablet (250 mg total) by mouth 2 (two) times daily. 10 tablet 0  . co-enzyme Q-10 30 MG capsule Take 30 mg by mouth 3 (three) times daily.    . diphenhydrAMINE (SOMINEX) 25 MG tablet Take 25 mg by mouth at bedtime as needed.    . fexofenadine (ALLEGRA) 30 MG tablet Take   30 mg by mouth daily.    . Flaxseed, Linseed, (FLAX SEED OIL PO) Take by mouth.    . ibuprofen (ADVIL,MOTRIN) 200 MG tablet Take 200 mg by mouth every 6 (six) hours as needed (pain).    . Multiple Vitamin (MULTIVITAMIN) tablet Take 1 tablet by mouth daily.      . Omega-3 Fatty Acids (FISH OIL BURP-LESS) 1000 MG CAPS Take by mouth. Reported on 08/02/2015    . pravastatin (PRAVACHOL) 40 MG tablet TAKE 1 TABLET BY MOUTH EVERY DAY 90 tablet 3  . pseudoephedrine (SUDAFED) 30 MG tablet Take 30 mg  by mouth every 4 (four) hours as needed. Reported on 08/02/2015    . vitamin C (VITAMIN C) 1000 MG tablet Take 1 tablet (1,000 mg total) by mouth daily. 120 tablet 0   No current facility-administered medications for this visit.     PHYSICAL EXAMINATION: ECOG PERFORMANCE STATUS: 1 - Symptomatic but completely ambulatory  Vitals:   03/16/18 0911  BP: (!) 172/112  Pulse: 94  Resp: 18  Temp: 97.9 F (36.6 C)  SpO2: 97%   Filed Weights   03/16/18 0911  Weight: 130 lb 9.6 oz (59.2 kg)    GENERAL:alert, no distress and comfortable SKIN: skin color, texture, turgor are normal, no rashes or significant lesions EYES: normal, Conjunctiva are pink and non-injected, sclera clear OROPHARYNX:no exudate, no erythema and lips, buccal mucosa, and tongue normal  NECK: supple, thyroid normal size, non-tender, without nodularity LYMPH:  no palpable lymphadenopathy in the cervical, axillary or inguinal LUNGS: clear to auscultation and percussion with normal breathing effort HEART: regular rate & rhythm and no murmurs and no lower extremity edema ABDOMEN:abdomen soft, non-tender and normal bowel sounds MUSCULOSKELETAL:no cyanosis of digits and no clubbing  NEURO: alert & oriented x 3 with fluent speech, no focal motor/sensory deficits EXTREMITIES: No lower extremity edema BREAST: No palpable masses or nodules in either right or left breasts. No palpable axillary supraclavicular or infraclavicular adenopathy no breast tenderness or nipple discharge. (exam performed in the presence of a chaperone)  LABORATORY DATA:  I have reviewed the data as listed CMP Latest Ref Rng & Units 08/19/2017 12/17/2016 08/01/2016  Glucose 70 - 99 mg/dL 94 - 97  BUN 6 - 23 mg/dL 9 - 9  Creatinine 0.40 - 1.20 mg/dL 0.69 - 0.71  Sodium 135 - 145 mEq/L 139 - 139  Potassium 3.5 - 5.1 mEq/L 3.3(L) - 3.6  Chloride 96 - 112 mEq/L 101 - 103  CO2 19 - 32 mEq/L 31 - 32  Calcium 8.4 - 10.5 mg/dL 9.2 - 9.6  Total Protein 6.0 -  8.3 g/dL 7.4 7.6 7.7  Total Bilirubin 0.2 - 1.2 mg/dL 1.3(H) 1.1 0.9  Alkaline Phos 39 - 117 U/L 76 73 78  AST 0 - 37 U/L 24 19 16  ALT 0 - 35 U/L 20 12 10    Lab Results  Component Value Date   WBC 5.6 08/19/2017   HGB 12.9 08/19/2017   HCT 38.0 08/19/2017   MCV 89.1 08/19/2017   PLT 336.0 08/19/2017   NEUTROABS 6.0 03/10/2014    ASSESSMENT & PLAN:  Breast cancer, right breast Right breast cancer diagnosed in 2002 and treated with neoadjuvant chemotherapy followed by surgery followed by additional adjuvant chemotherapy followed by radiation and antiestrogen therapy that was completed in 2007.  Breast Cancer Surveillance: 1. Breast exam  03/16/2018: Normal 2. Mammogram  02/20/2018 No abnormalities. Postsurgical changes. Breast Density Category C.   Patient loves to   travel (she is going to [place] and [place] this month.) Return to clinic in 1 year to follow with me    No orders of the defined types were placed in this encounter.  The patient has a good understanding of the overall plan. she agrees with it. she will call with any problems that may develop before the next visit here.   Harriette Ohara, MD 03/16/18

## 2018-08-31 ENCOUNTER — Encounter: Payer: Self-pay | Admitting: Internal Medicine

## 2018-08-31 ENCOUNTER — Ambulatory Visit (INDEPENDENT_AMBULATORY_CARE_PROVIDER_SITE_OTHER): Payer: Medicare Other | Admitting: Internal Medicine

## 2018-08-31 ENCOUNTER — Other Ambulatory Visit (INDEPENDENT_AMBULATORY_CARE_PROVIDER_SITE_OTHER): Payer: Medicare Other

## 2018-08-31 VITALS — BP 140/88 | HR 88 | Temp 97.8°F | Ht 62.0 in | Wt 126.0 lb

## 2018-08-31 DIAGNOSIS — C50311 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: Secondary | ICD-10-CM | POA: Diagnosis not present

## 2018-08-31 DIAGNOSIS — Z0000 Encounter for general adult medical examination without abnormal findings: Secondary | ICD-10-CM

## 2018-08-31 DIAGNOSIS — Z7184 Encounter for health counseling related to travel: Secondary | ICD-10-CM

## 2018-08-31 DIAGNOSIS — R030 Elevated blood-pressure reading, without diagnosis of hypertension: Secondary | ICD-10-CM

## 2018-08-31 DIAGNOSIS — E785 Hyperlipidemia, unspecified: Secondary | ICD-10-CM

## 2018-08-31 LAB — CBC
HCT: 40.2 % (ref 36.0–46.0)
HEMOGLOBIN: 13.4 g/dL (ref 12.0–15.0)
MCHC: 33.4 g/dL (ref 30.0–36.0)
MCV: 88.9 fl (ref 78.0–100.0)
PLATELETS: 344 10*3/uL (ref 150.0–400.0)
RBC: 4.53 Mil/uL (ref 3.87–5.11)
RDW: 13.2 % (ref 11.5–15.5)
WBC: 6 10*3/uL (ref 4.0–10.5)

## 2018-08-31 LAB — LIPID PANEL
CHOLESTEROL: 199 mg/dL (ref 0–200)
HDL: 59.1 mg/dL (ref >39.00)
LDL Cholesterol: 102 mg/dL — ABNORMAL HIGH (ref 0–99)
NonHDL: 139.88
TRIGLYCERIDES: 190 mg/dL — AB (ref 0.0–149.0)
Total CHOL/HDL Ratio: 3
VLDL: 38 mg/dL (ref 0.0–40.0)

## 2018-08-31 LAB — COMPREHENSIVE METABOLIC PANEL
ALBUMIN: 4.5 g/dL (ref 3.5–5.2)
ALT: 11 U/L (ref 0–35)
AST: 21 U/L (ref 0–37)
Alkaline Phosphatase: 59 U/L (ref 39–117)
BUN: 7 mg/dL (ref 6–23)
CALCIUM: 9.4 mg/dL (ref 8.4–10.5)
CHLORIDE: 103 meq/L (ref 96–112)
CO2: 29 mEq/L (ref 19–32)
CREATININE: 0.68 mg/dL (ref 0.40–1.20)
GFR: 85.54 mL/min (ref >60.00)
Glucose, Bld: 98 mg/dL (ref 70–99)
Potassium: 3.5 mEq/L (ref 3.5–5.1)
Sodium: 140 mEq/L (ref 135–145)
Total Bilirubin: 0.9 mg/dL (ref 0.2–1.2)
Total Protein: 7.6 g/dL (ref 6.0–8.3)

## 2018-08-31 MED ORDER — PRAVASTATIN SODIUM 40 MG PO TABS: 40.0000 mg | ORAL_TABLET | Freq: Every day | ORAL | 3 refills | Status: DC

## 2018-08-31 MED ORDER — CIPROFLOXACIN HCL 250 MG PO TABS: 250.0000 mg | ORAL_TABLET | Freq: Two times a day (BID) | ORAL | 0 refills | Status: DC

## 2018-08-31 MED ORDER — ALENDRONATE SODIUM 70 MG PO TABS: 70.0000 mg | ORAL_TABLET | ORAL | 3 refills | Status: DC

## 2018-08-31 NOTE — Assessment & Plan Note (Signed)
EKG done and BP okay at home. No signs of prolonged hypertension. Continue to monitor.

## 2018-08-31 NOTE — Progress Notes (Signed)
   Subjective:   Patient ID: Jennifer Hopkins, female    DOB: 11-24-1950, 68 y.o.   MRN: [id number]  HPI Here for medicare wellness and physical, no new complaints. Please see A/P for status and treatment of chronic medical problems.   Diet: heart healthy Physical activity: active Depression/mood screen: negative Hearing: intact to whispered voice Visual acuity: grossly normal, performs annual eye exam  ADLs: capable Fall risk: none Home safety: good Cognitive evaluation: intact to orientation, naming, recall and repetition EOL planning: adv directives discussed    Office Visit from 08/31/2018 in [place]  PHQ-2 Total Score  0      I have personally reviewed and have noted 1. The patient's medical and social history - reviewed today no changes 2. Their use of alcohol, tobacco or illicit drugs 3. Their current medications and supplements 4. The patient's functional ability including ADL's, fall risks, home safety risks and hearing or visual impairment. 5. Diet and physical activities 6. Evidence for depression or mood disorders 7. Care team reviewed and updated (available in snapshot)  Review of Systems  Constitutional: Negative.   HENT: Negative.   Eyes: Negative.   Respiratory: Negative for cough, chest tightness and shortness of breath.   Cardiovascular: Negative for chest pain, palpitations and leg swelling.  Gastrointestinal: Negative for abdominal distention, abdominal pain, constipation, diarrhea, nausea and vomiting.  Musculoskeletal: Negative.   Skin: Negative.   Neurological: Negative.   Psychiatric/Behavioral: Negative.     Objective:  Physical Exam Constitutional:      Appearance: She is well-developed.  HENT:     Head: Normocephalic and atraumatic.  Neck:     Musculoskeletal: Normal range of motion.  Cardiovascular:     Rate and Rhythm: Normal rate and regular rhythm.  Pulmonary:     Effort: Pulmonary effort is normal. No  respiratory distress.     Breath sounds: Normal breath sounds. No wheezing or rales.  Abdominal:     General: Bowel sounds are normal. There is no distension.     Palpations: Abdomen is soft.     Tenderness: There is no abdominal tenderness. There is no rebound.  Skin:    General: Skin is warm and dry.  Neurological:     Mental Status: She is alert and oriented to person, place, and time.     Coordination: Coordination normal.     Vitals:   08/31/18 0830  BP: (!) 150/90  Pulse: 88  Temp: 97.8 F (36.6 C)  TempSrc: Oral  SpO2: 98%  Weight: 126 lb (57.2 kg)  Height: 5\' 2"  (1.575 m)   EKG: Rate 73, axis normal, interval normal, sinus, no st or t wave changes, when compared to 2015 there are no significant changes.  Assessment & Plan:

## 2018-08-31 NOTE — Patient Instructions (Addendum)
We have sent in the ciprofloxacin to fill and take with you while traveling.    Health Maintenance, Female Adopting a healthy lifestyle and getting preventive care can go a long way to promote health and wellness. Talk with your health care provider about what schedule of regular examinations is right for you. This is a good chance for you to check in with your provider about disease prevention and staying healthy. In between checkups, there are plenty of things you can do on your own. Experts have done a lot of research about which lifestyle changes and preventive measures are most likely to keep you healthy. Ask your health care provider for more information. Weight and diet Eat a healthy diet  Be sure to include plenty of vegetables, fruits, low-fat dairy products, and lean protein.  Do not eat a lot of foods high in solid fats, added sugars, or salt.  Get regular exercise. This is one of the most important things you can do for your health. ? Most adults should exercise for at least 150 minutes each week. The exercise should increase your heart rate and make you sweat (moderate-intensity exercise). ? Most adults should also do strengthening exercises at least twice a week. This is in addition to the moderate-intensity exercise. Maintain a healthy weight  Body mass index (BMI) is a measurement that can be used to identify possible weight problems. It estimates body fat based on height and weight. Your health care provider can help determine your BMI and help you achieve or maintain a healthy weight.  For females 70 years of age and older: ? A BMI below 18.5 is considered underweight. ? A BMI of 18.5 to 24.9 is normal. ? A BMI of 25 to 29.9 is considered overweight. ? A BMI of 30 and above is considered obese. Watch levels of cholesterol and blood lipids  You should start having your blood tested for lipids and cholesterol at 70 years of age, then have this test every 5 years.  You may  need to have your cholesterol levels checked more often if: ? Your lipid or cholesterol levels are high. ? You are older than 70 years of age. ? You are at high risk for heart disease. Cancer screening Lung Cancer  Lung cancer screening is recommended for adults 30-76 years old who are at high risk for lung cancer because of a history of smoking.  A yearly low-dose CT scan of the lungs is recommended for people who: ? Currently smoke. ? Have quit within the past 15 years. ? Have at least a 30-pack-year history of smoking. A pack year is smoking an average of one pack of cigarettes a day for 1 year.  Yearly screening should continue until it has been 15 years since you quit.  Yearly screening should stop if you develop a health problem that would prevent you from having lung cancer treatment. Breast Cancer  Practice breast self-awareness. This means understanding how your breasts normally appear and feel.  It also means doing regular breast self-exams. Let your health care provider know about any changes, no matter how small.  If you are in your 20s or 30s, you should have a clinical breast exam (CBE) by a health care provider every 1-3 years as part of a regular health exam.  If you are 62 or older, have a CBE every year. Also consider having a breast X-ray (mammogram) every year.  If you have a family history of breast cancer, talk to your  health care provider about genetic screening.  If you are at high risk for breast cancer, talk to your health care provider about having an MRI and a mammogram every year.  Breast cancer gene (BRCA) assessment is recommended for women who have family members with BRCA-related cancers. BRCA-related cancers include: ? Breast. ? Ovarian. ? Tubal. ? Peritoneal cancers.  Results of the assessment will determine the need for genetic counseling and BRCA1 and BRCA2 testing. Cervical Cancer Your health care provider may recommend that you be screened  regularly for cancer of the pelvic organs (ovaries, uterus, and vagina). This screening involves a pelvic examination, including checking for microscopic changes to the surface of your cervix (Pap test). You may be encouraged to have this screening done every 3 years, beginning at age 76.  For women ages 70-65, health care providers may recommend pelvic exams and Pap testing every 3 years, or they may recommend the Pap and pelvic exam, combined with testing for human papilloma virus (HPV), every 5 years. Some types of HPV increase your risk of cervical cancer. Testing for HPV may also be done on women of any age with unclear Pap test results.  Other health care providers may not recommend any screening for nonpregnant women who are considered low risk for pelvic cancer and who do not have symptoms. Ask your health care provider if a screening pelvic exam is right for you.  If you have had past treatment for cervical cancer or a condition that could lead to cancer, you need Pap tests and screening for cancer for at least 20 years after your treatment. If Pap tests have been discontinued, your risk factors (such as having a new sexual partner) need to be reassessed to determine if screening should resume. Some women have medical problems that increase the chance of getting cervical cancer. In these cases, your health care provider may recommend more frequent screening and Pap tests. Colorectal Cancer  This type of cancer can be detected and often prevented.  Routine colorectal cancer screening usually begins at 70 years of age and continues through 70 years of age.  Your health care provider may recommend screening at an earlier age if you have risk factors for colon cancer.  Your health care provider may also recommend using home test kits to check for hidden blood in the stool.  A small camera at the end of a tube can be used to examine your colon directly (sigmoidoscopy or colonoscopy). This is  done to check for the earliest forms of colorectal cancer.  Routine screening usually begins at age 36.  Direct examination of the colon should be repeated every 5-10 years through 70 years of age. However, you may need to be screened more often if early forms of precancerous polyps or small growths are found. Skin Cancer  Check your skin from head to toe regularly.  Tell your health care provider about any new moles or changes in moles, especially if there is a change in a mole's shape or color.  Also tell your health care provider if you have a mole that is larger than the size of a pencil eraser.  Always use sunscreen. Apply sunscreen liberally and repeatedly throughout the day.  Protect yourself by wearing long sleeves, pants, a wide-brimmed hat, and sunglasses whenever you are outside. Heart disease, diabetes, and high blood pressure  High blood pressure causes heart disease and increases the risk of stroke. High blood pressure is more likely to develop in: ? People  who have blood pressure in the high end of the normal range (130-139/85-89 mm Hg). ? People who are overweight or obese. ? People who are African American.  If you are 75-52 years of age, have your blood pressure checked every 3-5 years. If you are 47 years of age or older, have your blood pressure checked every year. You should have your blood pressure measured twice-once when you are at a hospital or clinic, and once when you are not at a hospital or clinic. Record the average of the two measurements. To check your blood pressure when you are not at a hospital or clinic, you can use: ? An automated blood pressure machine at a pharmacy. ? A home blood pressure monitor.  If you are between 50 years and 23 years old, ask your health care provider if you should take aspirin to prevent strokes.  Have regular diabetes screenings. This involves taking a blood sample to check your fasting blood sugar level. ? If you are at a  normal weight and have a low risk for diabetes, have this test once every three years after 70 years of age. ? If you are overweight and have a high risk for diabetes, consider being tested at a younger age or more often. Preventing infection Hepatitis B  If you have a higher risk for hepatitis B, you should be screened for this virus. You are considered at high risk for hepatitis B if: ? You were born in a country where hepatitis B is common. Ask your health care provider which countries are considered high risk. ? Your parents were born in a high-risk country, and you have not been immunized against hepatitis B (hepatitis B vaccine). ? You have HIV or AIDS. ? You use needles to inject street drugs. ? You live with someone who has hepatitis B. ? You have had sex with someone who has hepatitis B. ? You get hemodialysis treatment. ? You take certain medicines for conditions, including cancer, organ transplantation, and autoimmune conditions. Hepatitis C  Blood testing is recommended for: ? Everyone born from [birth year] through 1967. ? Anyone with known risk factors for hepatitis C. Sexually transmitted infections (STIs)  You should be screened for sexually transmitted infections (STIs) including gonorrhea and chlamydia if: ? You are sexually active and are younger than 70 years of age. ? You are older than 70 years of age and your health care provider tells you that you are at risk for this type of infection. ? Your sexual activity has changed since you were last screened and you are at an increased risk for chlamydia or gonorrhea. Ask your health care provider if you are at risk.  If you do not have HIV, but are at risk, it may be recommended that you take a prescription medicine daily to prevent HIV infection. This is called pre-exposure prophylaxis (PrEP). You are considered at risk if: ? You are sexually active and do not regularly use condoms or know the HIV status of your partner(s). ? You  take drugs by injection. ? You are sexually active with a partner who has HIV. Talk with your health care provider about whether you are at high risk of being infected with HIV. If you choose to begin PrEP, you should first be tested for HIV. You should then be tested every 3 months for as long as you are taking PrEP. Pregnancy  If you are premenopausal and you may become pregnant, ask your health care provider about preconception  counseling.  If you may become pregnant, take 400 to 800 micrograms (mcg) of folic acid every day.  If you want to prevent pregnancy, talk to your health care provider about birth control (contraception). Osteoporosis and menopause  Osteoporosis is a disease in which the bones lose minerals and strength with aging. This can result in serious bone fractures. Your risk for osteoporosis can be identified using a bone density scan.  If you are 88 years of age or older, or if you are at risk for osteoporosis and fractures, ask your health care provider if you should be screened.  Ask your health care provider whether you should take a calcium or vitamin D supplement to lower your risk for osteoporosis.  Menopause may have certain physical symptoms and risks.  Hormone replacement therapy may reduce some of these symptoms and risks. Talk to your health care provider about whether hormone replacement therapy is right for you. Follow these instructions at home:  Schedule regular health, dental, and eye exams.  Stay current with your immunizations.  Do not use any tobacco products including cigarettes, chewing tobacco, or electronic cigarettes.  If you are pregnant, do not drink alcohol.  If you are breastfeeding, limit how much and how often you drink alcohol.  Limit alcohol intake to no more than 1 drink per day for nonpregnant women. One drink equals 12 ounces of beer, 5 ounces of wine, or 1 ounces of hard liquor.  Do not use street drugs.  Do not share  needles.  Ask your health care provider for help if you need support or information about quitting drugs.  Tell your health care provider if you often feel depressed.  Tell your health care provider if you have ever been abused or do not feel safe at home. This information is not intended to replace advice given to you by your health care provider. Make sure you discuss any questions you have with your health care provider. Document Released: 01/07/2011 Document Revised: 11/30/2015 Document Reviewed: 03/28/2015 Elsevier Interactive Patient Education  2019 [employer].

## 2018-08-31 NOTE — Assessment & Plan Note (Signed)
Checking lipid panel and adjust pravastatin 40 mg daily as needed.  

## 2018-08-31 NOTE — Assessment & Plan Note (Signed)
Still seeing oncology and still taking arimidex daily.

## 2018-08-31 NOTE — Assessment & Plan Note (Signed)
Flu shot up to date. Pneumonia complete. Shingrix counseled. Tetanus up to date. Colonoscopy up to date with Dr. Collene Mares. Mammogram up to date, pap smear aged out and dexa up to date. Counseled about sun safety and mole surveillance. Counseled about the dangers of distracted driving. Given 10 year screening recommendations.

## 2019-03-17 ENCOUNTER — Ambulatory Visit: Payer: Medicare Other | Admitting: Hematology and Oncology

## 2019-09-10 ENCOUNTER — Other Ambulatory Visit: Payer: Self-pay | Admitting: Internal Medicine

## 2019-09-10 ENCOUNTER — Telehealth: Payer: Self-pay | Admitting: Internal Medicine

## 2019-09-10 NOTE — Telephone Encounter (Signed)
New Message:   1.Medication Requested: pravastatin (PRAVACHOL) 40 MG tablet alendronate (FOSAMAX) 70 MG tablet  2. Pharmacy (Name, [place], City):[id number] - [place], [place] - [address]  3. On Med List:  Yes 4. Last Visit with PCP: 08/31/18  5. Next visit date with PCP: None   Agent: Please be advised that RX refills may take up to 3 business days. We ask that you follow-up with your pharmacy.

## 2019-09-13 MED ORDER — PRAVASTATIN SODIUM 40 MG PO TABS: 40.0000 mg | ORAL_TABLET | Freq: Every day | ORAL | 0 refills | Status: DC

## 2019-09-13 MED ORDER — ALENDRONATE SODIUM 70 MG PO TABS: 70.0000 mg | ORAL_TABLET | ORAL | 0 refills | Status: DC

## 2019-10-03 ENCOUNTER — Other Ambulatory Visit: Payer: Self-pay | Admitting: Internal Medicine

## 2019-10-05 ENCOUNTER — Other Ambulatory Visit: Payer: Self-pay | Admitting: Internal Medicine

## 2019-10-05 ENCOUNTER — Encounter: Payer: Medicare Other | Admitting: Internal Medicine

## 2019-10-12 ENCOUNTER — Other Ambulatory Visit: Payer: Self-pay | Admitting: Internal Medicine

## 2019-10-12 DIAGNOSIS — Z1231 Encounter for screening mammogram for malignant neoplasm of breast: Secondary | ICD-10-CM

## 2019-10-15 ENCOUNTER — Ambulatory Visit (INDEPENDENT_AMBULATORY_CARE_PROVIDER_SITE_OTHER): Payer: Medicare Other | Admitting: Internal Medicine

## 2019-10-15 ENCOUNTER — Encounter: Payer: Self-pay | Admitting: Internal Medicine

## 2019-10-15 ENCOUNTER — Other Ambulatory Visit: Payer: Self-pay

## 2019-10-15 VITALS — BP 140/86 | HR 98 | Temp 98.6°F | Ht 62.0 in | Wt 131.0 lb

## 2019-10-15 DIAGNOSIS — M810 Age-related osteoporosis without current pathological fracture: Secondary | ICD-10-CM

## 2019-10-15 DIAGNOSIS — E785 Hyperlipidemia, unspecified: Secondary | ICD-10-CM

## 2019-10-15 DIAGNOSIS — Z0000 Encounter for general adult medical examination without abnormal findings: Secondary | ICD-10-CM | POA: Diagnosis not present

## 2019-10-15 LAB — CBC
HCT: 38.4 % (ref 36.0–46.0)
Hemoglobin: 12.9 g/dL (ref 12.0–15.0)
MCHC: 33.6 g/dL (ref 30.0–36.0)
MCV: 88.1 fl (ref 78.0–100.0)
Platelets: 321 10*3/uL (ref 150.0–400.0)
RBC: 4.35 Mil/uL (ref 3.87–5.11)
RDW: 13.3 % (ref 11.5–15.5)
WBC: 8.1 10*3/uL (ref 4.0–10.5)

## 2019-10-15 LAB — LIPID PANEL
Cholesterol: 220 mg/dL — ABNORMAL HIGH (ref 0–200)
HDL: 51.8 mg/dL (ref >39.00)
LDL Cholesterol: 140 mg/dL — ABNORMAL HIGH (ref 0–99)
NonHDL: 167.72
Total CHOL/HDL Ratio: 4
Triglycerides: 141 mg/dL (ref 0.0–149.0)
VLDL: 28.2 mg/dL (ref 0.0–40.0)

## 2019-10-15 LAB — COMPREHENSIVE METABOLIC PANEL
ALT: 14 U/L (ref 0–35)
AST: 23 U/L (ref 0–37)
Albumin: 4.3 g/dL (ref 3.5–5.2)
Alkaline Phosphatase: 58 U/L (ref 39–117)
BUN: 7 mg/dL (ref 6–23)
CO2: 27 mEq/L (ref 19–32)
Calcium: 9.4 mg/dL (ref 8.4–10.5)
Chloride: 104 mEq/L (ref 96–112)
Creatinine, Ser: 0.63 mg/dL (ref 0.40–1.20)
GFR: 93.12 mL/min (ref >60.00)
Glucose, Bld: 98 mg/dL (ref 70–99)
Potassium: 3.2 mEq/L — ABNORMAL LOW (ref 3.5–5.1)
Sodium: 140 mEq/L (ref 135–145)
Total Bilirubin: 1.1 mg/dL (ref 0.2–1.2)
Total Protein: 7.3 g/dL (ref 6.0–8.3)

## 2019-10-15 MED ORDER — PRAVASTATIN SODIUM 40 MG PO TABS: ORAL_TABLET | ORAL | 3 refills | Status: DC

## 2019-10-15 MED ORDER — ALENDRONATE SODIUM 70 MG PO TABS: 70.0000 mg | ORAL_TABLET | ORAL | 3 refills | Status: DC

## 2019-10-15 NOTE — Assessment & Plan Note (Signed)
Flu shot up to date. Covid-19 complete. Pneumonia complete. Shingrix counseled. Tetanus due 2026. Colonoscopy due declines. Mammogram due 2021, pap smear aged out and dexa due 2022. Counseled about sun safety and mole surveillance. Counseled about the dangers of distracted driving. Given 10 year screening recommendations.

## 2019-10-15 NOTE — Progress Notes (Signed)
Subjective:   Patient ID: Jennifer Hopkins, female    DOB: 1950-11-11, 69 y.o.   MRN: [id number]  HPI Here for medicare wellness and physical, no new complaints. Please see A/P for status and treatment of chronic medical problems.   Diet: heart healthy Physical activity: walks daily Depression/mood screen: negative Hearing: intact to whispered voice Visual acuity: grossly normal, reading glasses, performs annual eye exam  ADLs: capable Fall risk: none Home safety: good Cognitive evaluation: intact to orientation, naming, recall and repetition EOL planning: adv directives discussed    Office Visit from 10/15/2019 in [place] at [facility] Total Score  0      I have personally reviewed and have noted 1. The patient's medical and social history - reviewed today no changes 2. Their use of alcohol, tobacco or illicit drugs 3. Their current medications and supplements 4. The patient's functional ability including ADL's, fall risks, home safety risks and hearing or visual impairment. 5. Diet and physical activities 6. Evidence for depression or mood disorders 7. Care team reviewed and updated  Patient Care Team: Hoyt Koch, MD as PCP - General (Internal Medicine) Past Medical History:  Diagnosis Date  . Allergy   . Cancer (HCC)    Breast  . Rheumatic fever    Past Surgical History:  Procedure Laterality Date  . BASAL CELL CARCINOMA EXCISION  12/2009  . BREAST LUMPECTOMY Right 02/2001  . [place]  2001   at [facility]  . OPEN REDUCTION INTERNAL FIXATION (ORIF) DISTAL RADIAL FRACTURE Left 08/17/2013   Procedure: OPEN REDUCTION INTERNAL FIXATION (ORIF) DISTAL RADIAL FRACTURE;  Surgeon: Roseanne Kaufman, MD;  Location: [place];  Service: Orthopedics;  Laterality: Left;   Family History  Problem Relation Age of Onset  . Cancer Mother        Breast Cancer  . Hyperlipidemia Father   . Heart disease Father   . Hypertension Father   . Heart disease  Paternal Aunt   . Heart disease Paternal Uncle   . Heart disease Paternal Grandmother   . Heart disease Paternal Grandfather    Review of Systems  Constitutional: Negative.   HENT: Negative.   Eyes: Negative.   Respiratory: Negative for cough, chest tightness and shortness of breath.   Cardiovascular: Negative for chest pain, palpitations and leg swelling.  Gastrointestinal: Negative for abdominal distention, abdominal pain, constipation, diarrhea, nausea and vomiting.  Musculoskeletal: Negative.   Skin: Negative.   Neurological: Negative.   Psychiatric/Behavioral: Negative.     Objective:  Physical Exam Constitutional:      Appearance: She is well-developed.  HENT:     Head: Normocephalic and atraumatic.  Cardiovascular:     Rate and Rhythm: Normal rate and regular rhythm.  Pulmonary:     Effort: Pulmonary effort is normal. No respiratory distress.     Breath sounds: Normal breath sounds. No wheezing or rales.  Abdominal:     General: Bowel sounds are normal. There is no distension.     Palpations: Abdomen is soft.     Tenderness: There is no abdominal tenderness. There is no rebound.  Musculoskeletal:     Cervical back: Normal range of motion.  Skin:    General: Skin is warm and dry.  Neurological:     Mental Status: She is alert and oriented to person, place, and time.     Coordination: Coordination normal.     Vitals:   10/15/19 1409  BP: 140/86  Pulse: 98  Temp:  98.6 F (37 C)  TempSrc: Oral  SpO2: 96%  Weight: 131 lb (59.4 kg)  Height: 5\' 2"  (1.575 m)   This visit occurred during the SARS-CoV-2 public health emergency.  Safety protocols were in place, including screening questions prior to the visit, additional usage of staff PPE, and extensive cleaning of exam room while observing appropriate contact time as indicated for disinfecting solutions.   Assessment & Plan:

## 2019-10-15 NOTE — Assessment & Plan Note (Signed)
Taking fosamax and refilled today. Due DEXA next year.

## 2019-10-15 NOTE — Assessment & Plan Note (Signed)
Checking lipid panel and adjust pravastatin as needed.  

## 2019-10-15 NOTE — Patient Instructions (Signed)
Health Maintenance, Female Adopting a healthy lifestyle and getting preventive care are important in promoting health and wellness. Ask your health care provider about:  The right schedule for you to have regular tests and exams.  Things you can do on your own to prevent diseases and keep yourself healthy. What should I know about diet, weight, and exercise? Eat a healthy diet   Eat a diet that includes plenty of vegetables, fruits, low-fat dairy products, and lean protein.  Do not eat a lot of foods that are high in solid fats, added sugars, or sodium. Maintain a healthy weight Body mass index (BMI) is used to identify weight problems. It estimates body fat based on height and weight. Your health care provider can help determine your BMI and help you achieve or maintain a healthy weight. Get regular exercise Get regular exercise. This is one of the most important things you can do for your health. Most adults should:  Exercise for at least 150 minutes each week. The exercise should increase your heart rate and make you sweat (moderate-intensity exercise).  Do strengthening exercises at least twice a week. This is in addition to the moderate-intensity exercise.  Spend less time sitting. Even light physical activity can be beneficial. Watch cholesterol and blood lipids Have your blood tested for lipids and cholesterol at 71 years of age, then have this test every 5 years. Have your cholesterol levels checked more often if:  Your lipid or cholesterol levels are high.  You are older than 71 years of age.  You are at high risk for heart disease. What should I know about cancer screening? Depending on your health history and family history, you may need to have cancer screening at various ages. This may include screening for:  Breast cancer.  Cervical cancer.  Colorectal cancer.  Skin cancer.  Lung cancer. What should I know about heart disease, diabetes, and high blood  pressure? Blood pressure and heart disease  High blood pressure causes heart disease and increases the risk of stroke. This is more likely to develop in people who have high blood pressure readings, are of African descent, or are overweight.  Have your blood pressure checked: ? Every 3-5 years if you are 18-39 years of age. ? Every year if you are 40 years old or older. Diabetes Have regular diabetes screenings. This checks your fasting blood sugar level. Have the screening done:  Once every three years after age 40 if you are at a normal weight and have a low risk for diabetes.  More often and at a younger age if you are overweight or have a high risk for diabetes. What should I know about preventing infection? Hepatitis B If you have a higher risk for hepatitis B, you should be screened for this virus. Talk with your health care provider to find out if you are at risk for hepatitis B infection. Hepatitis C Testing is recommended for:  Everyone born from 1945 through 1965.  Anyone with known risk factors for hepatitis C. Sexually transmitted infections (STIs)  Get screened for STIs, including gonorrhea and chlamydia, if: ? You are sexually active and are younger than 71 years of age. ? You are older than 71 years of age and your health care provider tells you that you are at risk for this type of infection. ? Your sexual activity has changed since you were last screened, and you are at increased risk for chlamydia or gonorrhea. Ask your health care provider if   you are at risk.  Ask your health care provider about whether you are at high risk for HIV. Your health care provider may recommend a prescription medicine to help prevent HIV infection. If you choose to take medicine to prevent HIV, you should first get tested for HIV. You should then be tested every 3 months for as long as you are taking the medicine. Pregnancy  If you are about to stop having your period (premenopausal) and  you may become pregnant, seek counseling before you get pregnant.  Take 400 to 800 micrograms (mcg) of folic acid every day if you become pregnant.  Ask for birth control (contraception) if you want to prevent pregnancy. Osteoporosis and menopause Osteoporosis is a disease in which the bones lose minerals and strength with aging. This can result in bone fractures. If you are 65 years old or older, or if you are at risk for osteoporosis and fractures, ask your health care provider if you should:  Be screened for bone loss.  Take a calcium or vitamin D supplement to lower your risk of fractures.  Be given hormone replacement therapy (HRT) to treat symptoms of menopause. Follow these instructions at home: Lifestyle  Do not use any products that contain nicotine or tobacco, such as cigarettes, e-cigarettes, and chewing tobacco. If you need help quitting, ask your health care provider.  Do not use street drugs.  Do not share needles.  Ask your health care provider for help if you need support or information about quitting drugs. Alcohol use  Do not drink alcohol if: ? Your health care provider tells you not to drink. ? You are pregnant, may be pregnant, or are planning to become pregnant.  If you drink alcohol: ? Limit how much you use to 0-1 drink a day. ? Limit intake if you are breastfeeding.  Be aware of how much alcohol is in your drink. In the [place] one drink equals one 12 oz bottle of beer (355 mL), one 5 oz glass of wine (148 mL), or one 1 oz glass of hard liquor (44 mL). General instructions  Schedule regular health, dental, and eye exams.  Stay current with your vaccines.  Tell your health care provider if: ? You often feel depressed. ? You have ever been abused or do not feel safe at home. Summary  Adopting a healthy lifestyle and getting preventive care are important in promoting health and wellness.  Follow your health care provider's instructions about healthy  diet, exercising, and getting tested or screened for diseases.  Follow your health care provider's instructions on monitoring your cholesterol and blood pressure. This information is not intended to replace advice given to you by your health care provider. Make sure you discuss any questions you have with your health care provider. Document Revised: 06/17/2018 Document Reviewed: 06/17/2018 Elsevier Patient Education  2020 Elsevier Inc.  

## 2020-02-10 DIAGNOSIS — Z03818 Encounter for observation for suspected exposure to other biological agents ruled out: Secondary | ICD-10-CM | POA: Diagnosis not present

## 2020-02-10 DIAGNOSIS — Z20822 Contact with and (suspected) exposure to covid-19: Secondary | ICD-10-CM | POA: Diagnosis not present

## 2020-03-07 ENCOUNTER — Telehealth: Payer: Self-pay | Admitting: Internal Medicine

## 2020-03-07 NOTE — Telephone Encounter (Signed)
Patient following up, awaiting your call re: something you wanted to discuss with her

## 2020-03-08 NOTE — Telephone Encounter (Signed)
Pt arrived in office stating her earlobe is split & she wanted to know if it can be repaired.  Upon examination, right earlobe noted to be split approx 3-11mm & healed (likely from earring).  Pt states this happened about a month ago. After discussion with Dr Ronnald Ramp, pt notified of his recommendation to see plastic surgeon.

## 2020-03-14 ENCOUNTER — Other Ambulatory Visit: Payer: Self-pay | Admitting: Internal Medicine

## 2020-03-14 DIAGNOSIS — S01319A Laceration without foreign body of unspecified ear, initial encounter: Secondary | ICD-10-CM | POA: Insufficient documentation

## 2020-03-23 ENCOUNTER — Other Ambulatory Visit: Payer: Self-pay

## 2020-03-23 DIAGNOSIS — C50311 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: Secondary | ICD-10-CM

## 2020-04-10 ENCOUNTER — Ambulatory Visit
Admission: RE | Admit: 2020-04-10 | Discharge: 2020-04-10 | Disposition: A | Discharge status: Home / Self Care | Payer: Medicare Other | Source: Ambulatory Visit | Attending: Hematology and Oncology | Admitting: Hematology and Oncology

## 2020-04-10 ENCOUNTER — Other Ambulatory Visit: Payer: Self-pay

## 2020-04-10 DIAGNOSIS — C50311 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: Secondary | ICD-10-CM

## 2020-04-10 DIAGNOSIS — Z1231 Encounter for screening mammogram for malignant neoplasm of breast: Secondary | ICD-10-CM | POA: Diagnosis not present

## 2020-04-19 ENCOUNTER — Encounter: Payer: Self-pay | Admitting: Plastic Surgery

## 2020-04-19 ENCOUNTER — Ambulatory Visit (INDEPENDENT_AMBULATORY_CARE_PROVIDER_SITE_OTHER): Payer: Self-pay | Admitting: Plastic Surgery

## 2020-04-19 ENCOUNTER — Other Ambulatory Visit: Payer: Self-pay

## 2020-04-19 VITALS — BP 130/91 | HR 98 | Temp 98.2°F | Ht 62.0 in | Wt 126.4 lb

## 2020-04-19 DIAGNOSIS — Z411 Encounter for cosmetic surgery: Secondary | ICD-10-CM

## 2020-04-19 NOTE — Progress Notes (Signed)
Referring Provider Hoyt Koch, MD [address] [place],  [place] [address]   CC:  Chief Complaint  Patient presents with  . Consult      Jennifer Hopkins is an 69 y.o. female.  HPI: Patient presents to discuss a torn right earlobe.  This happened years ago and she would like to discuss having it fixed.  The one on the left side is doing fine  No Known Allergies  Outpatient Encounter Medications as of 04/19/2020  Medication Sig  . alendronate (FOSAMAX) 70 MG tablet Take 1 tablet (70 mg total) by mouth every 7 (seven) days. Take with a full glass of water on an empty stomach.  . co-enzyme Q-10 30 MG capsule Take 30 mg by mouth 3 (three) times daily.  . diphenhydrAMINE (SOMINEX) 25 MG tablet Take 25 mg by mouth at bedtime as needed.  . fexofenadine (ALLEGRA) 30 MG tablet Take 30 mg by mouth daily.  . Flaxseed, Linseed, (FLAX SEED OIL PO) Take by mouth.  . Multiple Vitamin (MULTIVITAMIN) tablet Take 1 tablet by mouth daily.    . pravastatin (PRAVACHOL) 40 MG tablet TAKE 1 TABLET DAILY.  . calcium-vitamin D (OSCAL WITH D) 250-125 MG-UNIT per tablet Take 1 tablet by mouth daily.    . cholecalciferol (VITAMIN D) 1000 UNITS tablet Take 1,000 Units by mouth daily. Pt.  Not sure of dosage   . ciprofloxacin (CIPRO) 250 MG tablet Take 1 tablet (250 mg total) by mouth 2 (two) times daily.  [name] ibuprofen (ADVIL,MOTRIN) 200 MG tablet Take 200 mg by mouth every 6 (six) hours as needed (pain). (Patient not taking: Reported on 04/19/2020)  . Omega-3 Fatty Acids (FISH OIL BURP-LESS) 1000 MG CAPS Take by mouth. Reported on 08/02/2015  . pseudoephedrine (SUDAFED) 30 MG tablet Take 30 mg by mouth every 4 (four) hours as needed. Reported on 08/02/2015 (Patient not taking: Reported on 04/19/2020)  . vitamin C (VITAMIN C) 1000 MG tablet Take 1 tablet (1,000 mg total) by mouth daily.   No facility-administered encounter medications on file as of 04/19/2020.     Past Medical History:    Diagnosis Date  . Allergy   . Cancer (HCC)    Breast  . Rheumatic fever     Past Surgical History:  Procedure Laterality Date  . BASAL CELL CARCINOMA EXCISION  12/2009  . BREAST LUMPECTOMY Right 02/2001  . [place]  2001   at [facility]  . OPEN REDUCTION INTERNAL FIXATION (ORIF) DISTAL RADIAL FRACTURE Left 08/17/2013   Procedure: OPEN REDUCTION INTERNAL FIXATION (ORIF) DISTAL RADIAL FRACTURE;  Surgeon: Roseanne Kaufman, MD;  Location: [place];  Service: Orthopedics;  Laterality: Left;    Family History  Problem Relation Age of Onset  . Cancer Mother        Breast Cancer  . Hyperlipidemia Father   . Heart disease Father   . Hypertension Father   . Heart disease Paternal Aunt   . Heart disease Paternal Uncle   . Heart disease Paternal Grandmother   . Heart disease Paternal Grandfather     Social History   Social History Narrative   DIVORCED   2 CHILDREN           Review of Systems General: Denies fevers, chills, weight loss CV: Denies chest pain, shortness of breath, palpitations  Physical Exam Vitals with BMI 04/19/2020 10/15/2019 08/31/2018  Height 5\' 2"  5\' 2"  -  Weight 126 lbs 6 oz 131 lbs -  BMI 23.11 23.95 -  Systolic 938 182 993  Diastolic 91 86 88  Pulse 98 98 -    General:  No acute distress,  Alert and oriented, Non-Toxic, Normal speech and affect Examination shows a torn right earlobe.  There is no other scars or complicating factors  Assessment/Plan Patient presents with a torn right earlobe.  This is amenable to excision and primary repair.  Discussed the procedure in detail and the risks include bleeding, infection, damage to surrounding structures need for additional procedures.  We then discussed waiting at least 2 months prior to repiercing the ear.  She is fully understanding and will plan to schedule this under local in the office.  Cindra Presume 04/19/2020, 2:25 PM

## 2020-04-25 NOTE — Progress Notes (Signed)
 Patient Care Team: Crawford, Elizabeth A, MD as PCP - General (Internal Medicine)  DIAGNOSIS:    ICD-10-CM   1. 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, unspecified estrogen receptor status (HCC)  C50.311     SUMMARY OF ONCOLOGIC HISTORY: Oncology History  Breast cancer, right breast (HCC)  10/16/2000 Initial Diagnosis   Mammogram revealed mass in the right breast biopsy showed IDC with DCIS ER 90% PR 81% Ki-67 6% HER-2 negative   11/10/2000 Surgery   Sentinel lymph node study for lymph nodes negative   12/08/2000 - 01/28/2001 Neo-Adjuvant Chemotherapy   4 cycles of Adriamycin and Taxotere   02/06/2001 Surgery   Lumpectomy showed residual IMC and DCIS 1.5 cm   03/10/2001 - 04/20/2001 Chemotherapy   CMF chemotherapy    Radiation Therapy   Radiation therapy to the right breast   05/10/2001 - 05/10/2006 Anti-estrogen oral therapy   Tamoxifen 20 mg daily for 5 years   01/09/2010 Relapse/Recurrence   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 Kinard     CHIEF COMPLIANT: Surveillance of breast cancer  INTERVAL HISTORY: Jennifer Hopkins is a 69 y.o. with above-mentioned history of left breast cancer who is currently on surveillance. Mammogram on 04/10/20 showed no evidence of malignancy bilaterally. She presents to the clinic today for follow-up.   ALLERGIES:  has No Known Allergies.  MEDICATIONS:  Current Outpatient Medications  Medication Sig Dispense Refill  . alendronate (FOSAMAX) 70 MG tablet Take 1 tablet (70 mg total) by mouth every 7 (seven) days. Take with a full glass of water on an empty stomach. 12 tablet 3  . calcium-vitamin D (OSCAL WITH D) 250-125 MG-UNIT per tablet Take 1 tablet by  mouth daily.      . cholecalciferol (VITAMIN D) 1000 UNITS tablet Take 1,000 Units by mouth daily. Pt.  Not sure of dosage     . ciprofloxacin (CIPRO) 250 MG tablet Take 1 tablet (250 mg total) by mouth 2 (two) times daily. 10 tablet 0  . co-enzyme Q-10 30 MG capsule Take 30 mg by mouth 3 (three) times daily.    . diphenhydrAMINE (SOMINEX) 25 MG tablet Take 25 mg by mouth at bedtime as needed.    . fexofenadine (ALLEGRA) 30 MG tablet Take 30 mg by mouth daily.    . Flaxseed, Linseed, (FLAX SEED OIL PO) Take by mouth.    . ibuprofen (ADVIL,MOTRIN) 200 MG tablet Take 200 mg by mouth every 6 (six) hours as needed (pain). (Patient not taking: Reported on 04/19/2020)    . Multiple Vitamin (MULTIVITAMIN) tablet Take 1 tablet by mouth daily.      . Omega-3 Fatty Acids (FISH OIL BURP-LESS) 1000 MG CAPS Take by mouth. Reported on 08/02/2015    . pravastatin (PRAVACHOL) 40 MG tablet TAKE 1 TABLET DAILY. 90 tablet 3  . pseudoephedrine (SUDAFED) 30 MG tablet Take 30 mg by mouth every 4 (four) hours as needed. Reported on 08/02/2015 (Patient not taking: Reported on 04/19/2020)    . vitamin C (VITAMIN C) 1000 MG tablet Take 1 tablet (1,000 mg total) by mouth daily. 120 tablet 0   No current facility-administered medications for this visit.    PHYSICAL EXAMINATION: ECOG PERFORMANCE STATUS: 0 - Asymptomatic    Vitals:   04/26/20 1124  BP: (!) 149/92  Pulse: 94  Resp: 18  Temp: 97.7 F (36.5 C)  SpO2: 95%   Filed Weights   04/26/20 1124  Weight: 126 lb 14.4 oz (57.6 kg)    BREAST: No palpable masses or nodules in either right or left breasts. No palpable axillary supraclavicular or infraclavicular adenopathy no breast tenderness or nipple discharge. (exam performed in the presence of a chaperone)  LABORATORY DATA:  I have reviewed the data as listed CMP Latest Ref Rng & Units 10/15/2019 08/31/2018 08/19/2017  Glucose 70 - 99 mg/dL 98 98 94  BUN 6 - 23 mg/dL _0 Creatinine 0.40 - 1.20 mg/dL 0.63  0.68 0.69  Sodium 135 - 145 mEq/L 140 140 139  Potassium 3.5 - 5.1 mEq/L 3.2(L) 3.5 3.3(L)  Chloride 96 - 112 mEq/L 104 103 101  CO2 19 - 32 mEq/L _1 Calcium 8.4 - 10.5 mg/dL 9.4 9.4 9.2  Total Protein 6.0 - 8.3 g/dL 7.3 7.6 7.4  Total Bilirubin 0.2 - 1.2 mg/dL 1.1 0.9 1.3(H)  Alkaline Phos 39 - 117 U/L 58 59 76  AST 0 - 37 U/L _2 ALT 0 - 35 U/L _3 Lab Results  Component Value Date   WBC 8.1 10/15/2019   HGB 12.9 10/15/2019   HCT 38.4 10/15/2019   MCV 88.1 10/15/2019   PLT 321.0 10/15/2019   NEUTROABS 6.0 03/10/2014    ASSESSMENT & PLAN:  Breast cancer, right breast Right breast cancer diagnosed in 2002 and treated with neoadjuvant chemotherapy followed by surgery followed by additional adjuvant chemotherapy followed by radiation and antiestrogen therapy that was completed in 2007.  Breast Cancer Surveillance: 1. Breast exam10/20/2021: Benign 2. Mammogram10/6/2021no abnormalities. Postsurgical changes. Breast Density Category C.   Patient loves to travel Return to clinic in 1 year to follow with me    No orders of the defined types were placed in this encounter.  The patient has a good understanding of the overall plan. she agrees with it. she will call with any problems that may develop before the next visit here.  Total time spent: 20 mins including face to face time and time spent for planning, charting and coordination of care  Nicholas Lose, MD 04/26/2020  I, Cloyde Reams Dorshimer, am acting as scribe for Dr. Nicholas Lose.  I have reviewed the above documentation for accuracy and completeness, and I agree with the above.

## 2020-04-26 ENCOUNTER — Inpatient Hospital Stay: Payer: Medicare Other | Attending: Hematology and Oncology | Admitting: Hematology and Oncology

## 2020-04-26 ENCOUNTER — Other Ambulatory Visit: Payer: Self-pay

## 2020-04-26 DIAGNOSIS — Z923 Personal history of irradiation: Secondary | ICD-10-CM | POA: Diagnosis not present

## 2020-04-26 DIAGNOSIS — Z853 Personal history of malignant neoplasm of breast: Secondary | ICD-10-CM | POA: Insufficient documentation

## 2020-04-26 DIAGNOSIS — C50311 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: Secondary | ICD-10-CM | POA: Diagnosis not present

## 2020-04-26 DIAGNOSIS — Z79899 Other long term (current) drug therapy: Secondary | ICD-10-CM | POA: Diagnosis not present

## 2020-04-26 DIAGNOSIS — Z791 Long term (current) use of non-steroidal anti-inflammatories (NSAID): Secondary | ICD-10-CM | POA: Diagnosis not present

## 2020-04-26 DIAGNOSIS — Z9221 Personal history of antineoplastic chemotherapy: Secondary | ICD-10-CM | POA: Insufficient documentation

## 2020-04-26 NOTE — Assessment & Plan Note (Signed)
Right breast cancer diagnosed in 2002 and treated with neoadjuvant chemotherapy followed by surgery followed by additional adjuvant chemotherapy followed by radiation and antiestrogen therapy that was completed in 2007.  Breast Cancer Surveillance: 1. Breast exam10/20/2021: Benign 2. Mammogram10/6/2021no abnormalities. Postsurgical changes. Breast Density Category C.   Patient loves to travel Return to clinic in 1 year to follow with me

## 2020-05-29 ENCOUNTER — Ambulatory Visit (INDEPENDENT_AMBULATORY_CARE_PROVIDER_SITE_OTHER): Payer: Self-pay | Admitting: Plastic Surgery

## 2020-05-29 ENCOUNTER — Encounter: Payer: Self-pay | Admitting: Plastic Surgery

## 2020-05-29 ENCOUNTER — Other Ambulatory Visit: Payer: Self-pay

## 2020-05-29 VITALS — BP 139/78 | HR 102 | Temp 98.3°F

## 2020-05-29 DIAGNOSIS — Z411 Encounter for cosmetic surgery: Secondary | ICD-10-CM

## 2020-05-29 NOTE — Progress Notes (Signed)
Operative Note   DATE OF OPERATION: 05/29/2020  LOCATION:    SURGICAL DEPARTMENT: Plastic Surgery  PREOPERATIVE DIAGNOSES:  Right ear lobe tear  POSTOPERATIVE DIAGNOSES:  same  PROCEDURE:  1. Right earlobe repair  SURGEON: Talmadge Coventry, MD  ANESTHESIA:  Local  COMPLICATIONS: None.   INDICATIONS FOR PROCEDURE:  The patient, Jennifer Hopkins is a 69 y.o. female born on 11/15/1944, is here for treatment of right earlobe tear MRN: [id number]  CONSENT:  Informed consent was obtained directly from the patient. Risks, benefits and alternatives were fully discussed. Specific risks including but not limited to bleeding, infection, hematoma, seroma, scarring, pain, infection, wound healing problems, and need for further surgery were all discussed. The patient did have an ample opportunity to have questions answered to satisfaction.   DESCRIPTION OF PROCEDURE:  Local anesthesia was administered. The patient's operative site was prepped and draped in a sterile fashion. A time out was performed and all information was confirmed to be correct.  The skin on either side of the tear was excised with an 11 blade.  Hemostasis was obtained.  The tear was then closed with interrupted 5-0 Monocryl mattress sutures.  This gave a nice on table result The patient tolerated the procedure well.  There were no complications.

## 2020-06-13 NOTE — Progress Notes (Signed)
Patient is a 69 year old female here for follow-up after undergoing right earlobe repair on 05/29/2020 with Dr. Claudia Desanctis.  ~ 2.5 weeks PO Patient reports she is doing well.  Denies fever/chills, nausea/vomiting, pain at incision site.  Incision of right earlobe is healing very nicely, C/D/I.  No signs of infection, redness, drainage, seroma/hematoma.  Sutures were removed today.  She may apply Vaseline for moisture as desired.  Recommend waiting 3 months before repiercing her ear.  Follow-up in 3 months for ear repiercing.  Provided return precautions.  Call office with any questions/concerns.    Pictures were obtained of the patient and placed in the chart with the patient's or guardian's permission.

## 2020-06-15 ENCOUNTER — Ambulatory Visit: Payer: Medicare Other | Admitting: Plastic Surgery

## 2020-06-15 ENCOUNTER — Other Ambulatory Visit: Payer: Self-pay

## 2020-06-15 ENCOUNTER — Encounter: Payer: Self-pay | Admitting: Plastic Surgery

## 2020-06-15 VITALS — BP 143/73 | HR 87 | Temp 98.0°F

## 2020-06-15 DIAGNOSIS — S01311D Laceration without foreign body of right ear, subsequent encounter: Secondary | ICD-10-CM

## 2020-06-26 DIAGNOSIS — H524 Presbyopia: Secondary | ICD-10-CM | POA: Diagnosis not present

## 2020-06-27 DIAGNOSIS — H5203 Hypermetropia, bilateral: Secondary | ICD-10-CM | POA: Diagnosis not present

## 2020-07-14 DIAGNOSIS — D1801 Hemangioma of skin and subcutaneous tissue: Secondary | ICD-10-CM | POA: Diagnosis not present

## 2020-07-14 DIAGNOSIS — D225 Melanocytic nevi of trunk: Secondary | ICD-10-CM | POA: Diagnosis not present

## 2020-07-14 DIAGNOSIS — L905 Scar conditions and fibrosis of skin: Secondary | ICD-10-CM | POA: Diagnosis not present

## 2020-07-14 DIAGNOSIS — L821 Other seborrheic keratosis: Secondary | ICD-10-CM | POA: Diagnosis not present

## 2020-09-08 ENCOUNTER — Other Ambulatory Visit: Payer: Self-pay | Admitting: Internal Medicine

## 2020-09-13 ENCOUNTER — Ambulatory Visit (INDEPENDENT_AMBULATORY_CARE_PROVIDER_SITE_OTHER): Payer: Self-pay | Admitting: Plastic Surgery

## 2020-09-13 ENCOUNTER — Encounter: Payer: Self-pay | Admitting: Plastic Surgery

## 2020-09-13 ENCOUNTER — Other Ambulatory Visit: Payer: Self-pay

## 2020-09-13 VITALS — BP 153/90 | HR 96

## 2020-09-13 DIAGNOSIS — Z411 Encounter for cosmetic surgery: Secondary | ICD-10-CM

## 2020-09-13 DIAGNOSIS — L989 Disorder of the skin and subcutaneous tissue, unspecified: Secondary | ICD-10-CM

## 2020-09-13 NOTE — Progress Notes (Signed)
Patient presents after repair of her right torn earlobe.  She is interested in having it appears.  We agreed upon a location just adjacent to the scar and it was pierced atraumatically with the piercing gun.  She is happy with this result.  On examination of her left ear she has developed a complete tear on that side as well that she is interested in having repaired.  On the earlobe anterior and inferior to the 10-year there looks to be a large seborrheic keratosis that the patient is interested in having removed.  I explained that removal of the lesion would cause a change in the shape of her earlobe in that location and she is okay with that and simply wants the lesion to be removed.  We will plan to do this at the same time.  All of her questions were answered.

## 2020-10-16 ENCOUNTER — Encounter: Payer: Self-pay | Admitting: Internal Medicine

## 2020-10-16 ENCOUNTER — Ambulatory Visit (INDEPENDENT_AMBULATORY_CARE_PROVIDER_SITE_OTHER): Payer: Medicare Other | Admitting: Internal Medicine

## 2020-10-16 ENCOUNTER — Other Ambulatory Visit: Payer: Self-pay

## 2020-10-16 VITALS — BP 122/76 | HR 100 | Temp 98.3°F | Resp 18 | Ht 62.0 in | Wt 123.4 lb

## 2020-10-16 DIAGNOSIS — M810 Age-related osteoporosis without current pathological fracture: Secondary | ICD-10-CM | POA: Diagnosis not present

## 2020-10-16 DIAGNOSIS — R030 Elevated blood-pressure reading, without diagnosis of hypertension: Secondary | ICD-10-CM | POA: Diagnosis not present

## 2020-10-16 DIAGNOSIS — E785 Hyperlipidemia, unspecified: Secondary | ICD-10-CM

## 2020-10-16 DIAGNOSIS — Z0000 Encounter for general adult medical examination without abnormal findings: Secondary | ICD-10-CM

## 2020-10-16 LAB — COMPREHENSIVE METABOLIC PANEL
ALT: 12 U/L (ref 0–35)
AST: 20 U/L (ref 0–37)
Albumin: 4.3 g/dL (ref 3.5–5.2)
Alkaline Phosphatase: 59 U/L (ref 39–117)
BUN: 8 mg/dL (ref 6–23)
CO2: 34 mEq/L — ABNORMAL HIGH (ref 19–32)
Calcium: 9.8 mg/dL (ref 8.4–10.5)
Chloride: 100 mEq/L (ref 96–112)
Creatinine, Ser: 0.7 mg/dL (ref 0.40–1.20)
GFR: 86.57 mL/min (ref >60.00)
Glucose, Bld: 89 mg/dL (ref 70–99)
Potassium: 3.3 mEq/L — ABNORMAL LOW (ref 3.5–5.1)
Sodium: 141 mEq/L (ref 135–145)
Total Bilirubin: 1.2 mg/dL (ref 0.2–1.2)
Total Protein: 7.6 g/dL (ref 6.0–8.3)

## 2020-10-16 LAB — CBC
HCT: 39.9 % (ref 36.0–46.0)
Hemoglobin: 13.6 g/dL (ref 12.0–15.0)
MCHC: 34.2 g/dL (ref 30.0–36.0)
MCV: 87.1 fl (ref 78.0–100.0)
Platelets: 352 10*3/uL (ref 150.0–400.0)
RBC: 4.58 Mil/uL (ref 3.87–5.11)
RDW: 13 % (ref 11.5–15.5)
WBC: 8.2 10*3/uL (ref 4.0–10.5)

## 2020-10-16 LAB — LIPID PANEL
Cholesterol: 222 mg/dL — ABNORMAL HIGH (ref 0–200)
HDL: 61.4 mg/dL (ref >39.00)
LDL Cholesterol: 137 mg/dL — ABNORMAL HIGH (ref 0–99)
NonHDL: 161.07
Total CHOL/HDL Ratio: 4
Triglycerides: 121 mg/dL (ref 0.0–149.0)
VLDL: 24.2 mg/dL (ref 0.0–40.0)

## 2020-10-16 NOTE — Assessment & Plan Note (Signed)
Checking lipid panel and adjust pravastatin 40 mg daily as needed.  

## 2020-10-16 NOTE — Progress Notes (Signed)
Subjective:   Patient ID: Jennifer Hopkins, female    DOB: 12/14/1950, 70 y.o.   MRN: [id number]  HPI Here for medicare wellness and physical, no new complaints. Please see A/P for status and treatment of chronic medical problems.   [facility] for colonoscopy  Diet: heart healthy Physical activity: sedentary Depression/mood screen: negative Hearing: intact to whispered voice, mild loss bilaterally Visual acuity: grossly normal with lens, performs annual eye exam  ADLs: capable Fall risk: none Home safety: good Cognitive evaluation: intact to orientation, naming, recall and repetition EOL planning: adv directives discussed, in place  [employer] Visit from 10/16/2020 in [place] at [facility] Total Score 0      I have personally reviewed and have noted 1. The patient's medical and social history - reviewed today no changes 2. Their use of alcohol, tobacco or illicit drugs 3. Their current medications and supplements 4. The patient's functional ability including ADL's, fall risks, home safety risks and hearing or visual impairment. 5. Diet and physical activities 6. Evidence for depression or mood disorders 7. Care team reviewed and updated 8.  The patient is not on an opioid pain medication.  Patient Care Team: Hoyt Koch, MD as PCP - General (Internal Medicine) Past Medical History:  Diagnosis Date  . Allergy   . Cancer (HCC)    Breast  . Rheumatic fever    Past Surgical History:  Procedure Laterality Date  . BASAL CELL CARCINOMA EXCISION  12/2009  . BREAST LUMPECTOMY Right 02/2001  . [place]  2001   at [facility]  . OPEN REDUCTION INTERNAL FIXATION (ORIF) DISTAL RADIAL FRACTURE Left 08/17/2013   Procedure: OPEN REDUCTION INTERNAL FIXATION (ORIF) DISTAL RADIAL FRACTURE;  Surgeon: Roseanne Kaufman, MD;  Location: [place];  Service: Orthopedics;  Laterality: Left;   Family History  Problem Relation Age of Onset  . Cancer Mother         Breast Cancer  . Hyperlipidemia Father   . Heart disease Father   . Hypertension Father   . Heart disease Paternal Aunt   . Heart disease Paternal Uncle   . Heart disease Paternal Grandmother   . Heart disease Paternal Grandfather    Review of Systems  Constitutional: Negative.   HENT: Negative.   Eyes: Negative.   Respiratory: Negative for cough, chest tightness and shortness of breath.   Cardiovascular: Negative for chest pain, palpitations and leg swelling.  Gastrointestinal: Negative for abdominal distention, abdominal pain, constipation, diarrhea, nausea and vomiting.  Musculoskeletal: Negative.   Skin: Negative.   Neurological: Negative.   Psychiatric/Behavioral: Negative.     Objective:  Physical Exam Constitutional:      Appearance: She is well-developed.  HENT:     Head: Normocephalic and atraumatic.  Cardiovascular:     Rate and Rhythm: Normal rate and regular rhythm.  Pulmonary:     Effort: Pulmonary effort is normal. No respiratory distress.     Breath sounds: Normal breath sounds. No wheezing or rales.  Abdominal:     General: Bowel sounds are normal. There is no distension.     Palpations: Abdomen is soft.     Tenderness: There is no abdominal tenderness. There is no rebound.  Musculoskeletal:     Cervical back: Normal range of motion.  Skin:    General: Skin is warm and dry.  Neurological:     Mental Status: She is alert and oriented to person, place, and time.     Coordination:  Coordination normal.     Vitals:   10/16/20 1253  BP: 122/76  Pulse: 100  Resp: 18  Temp: 98.3 F (36.8 C)  TempSrc: Oral  SpO2: 97%  Weight: 123 lb 6.4 oz (56 kg)  Height: 5\' 2"  (1.575 m)   This visit occurred during the SARS-CoV-2 public health emergency.  Safety protocols were in place, including screening questions prior to the visit, additional usage of staff PPE, and extensive cleaning of exam room while observing appropriate contact time as indicated for  disinfecting solutions.   Assessment & Plan:

## 2020-10-16 NOTE — Assessment & Plan Note (Signed)
BP normal today, will continue with monitoring at every visit. Increasing activity has helped control her BP.

## 2020-10-16 NOTE — Assessment & Plan Note (Signed)
Flu shot yearly. Covid-19 3 shots counseled about 4th. Pneumonia complete. Shingrix complete. Tetanus due 2026. Colonoscopy due 5 years she is not sure of last getting records from [facility] (she thinks this is office who did her colonoscopy in past). Mammogram due 2023, pap smear aged out and dexa ordered today. Counseled about sun safety and mole surveillance. Counseled about the dangers of distracted driving. Given 10 year screening recommendations.

## 2020-10-16 NOTE — Patient Instructions (Addendum)
We will recheck the bone density.    Heart-Healthy Eating Plan Heart-healthy meal planning includes:  Eating less unhealthy fats.  Eating more healthy fats.  Making other changes in your diet. Talk with your doctor or a diet specialist (dietitian) to create an eating plan that is right for you. What is my plan? Your doctor may recommend an eating plan that includes:  Total fat: ______% or less of total calories a day.  Saturated fat: ______% or less of total calories a day.  Cholesterol: less than _________mg a day. What are tips for following this plan? Cooking Avoid frying your food. Try to bake, boil, grill, or broil it instead. You can also reduce fat by:  Removing the skin from poultry.  Removing all visible fats from meats.  Steaming vegetables in water or broth. Meal planning  At meals, divide your plate into four equal parts: ? Fill one-half of your plate with vegetables and green salads. ? Fill one-fourth of your plate with whole grains. ? Fill one-fourth of your plate with lean protein foods.  Eat 4-5 servings of vegetables per day. A serving of vegetables is: ? 1 cup of raw or cooked vegetables. ? 2 cups of raw leafy greens.  Eat 4-5 servings of fruit per day. A serving of fruit is: ? 1 medium whole fruit. ?  cup of dried fruit. ?  cup of fresh, frozen, or canned fruit. ?  cup of 100% fruit juice.  Eat more foods that have soluble fiber. These are apples, broccoli, carrots, beans, peas, and barley. Try to get 20-30 g of fiber per day.  Eat 4-5 servings of nuts, legumes, and seeds per week: ? 1 serving of dried beans or legumes equals  cup after being cooked. ? 1 serving of nuts is  cup. ? 1 serving of seeds equals 1 tablespoon.   General information  Eat more home-cooked food. Eat less restaurant, buffet, and fast food.  Limit or avoid alcohol.  Limit foods that are high in starch and sugar.  Avoid fried foods.  Lose weight if you are  overweight.  Keep track of how much salt (sodium) you eat. This is important if you have high blood pressure. Ask your doctor to tell you more about this.  Try to add vegetarian meals each week. Fats  Choose healthy fats. These include olive oil and canola oil, flaxseeds, walnuts, almonds, and seeds.  Eat more omega-3 fats. These include salmon, mackerel, sardines, tuna, flaxseed oil, and ground flaxseeds. Try to eat fish at least 2 times each week.  Check food labels. Avoid foods with trans fats or high amounts of saturated fat.  Limit saturated fats. ? These are often found in animal products, such as meats, butter, and cream. ? These are also found in plant foods, such as palm oil, palm kernel oil, and coconut oil.  Avoid foods with partially hydrogenated oils in them. These have trans fats. Examples are stick margarine, some tub margarines, cookies, crackers, and other baked goods. What foods can I eat? Fruits All fresh, canned (in natural juice), or frozen fruits. Vegetables Fresh or frozen vegetables (raw, steamed, roasted, or grilled). Green salads. Grains Most grains. Choose whole wheat and whole grains most of the time. Rice and pasta, including brown rice and pastas made with whole wheat. Meats and other proteins Lean, well-trimmed beef, veal, pork, and lamb. Chicken and [place] without skin. All fish and shellfish. Wild duck, rabbit, pheasant, and venison. Egg whites or low-cholesterol egg  substitutes. Dried beans, peas, lentils, and tofu. Seeds and most nuts. Dairy Low-fat or nonfat cheeses, including ricotta and mozzarella. Skim or 1% milk that is liquid, powdered, or evaporated. Buttermilk that is made with low-fat milk. Nonfat or low-fat yogurt. Fats and oils Non-hydrogenated (trans-free) margarines. Vegetable oils, including soybean, sesame, sunflower, olive, peanut, safflower, corn, canola, and cottonseed. Salad dressings or mayonnaise made with a vegetable  oil. Beverages Mineral water. Coffee and tea. Diet carbonated beverages. Sweets and desserts Sherbet, gelatin, and fruit ice. Small amounts of dark chocolate. Limit all sweets and desserts. Seasonings and condiments All seasonings and condiments. The items listed above may not be a complete list of foods and drinks you can eat. Contact a dietitian for more options. What foods should I avoid? Fruits Canned fruit in heavy syrup. Fruit in cream or butter sauce. Fried fruit. Limit coconut. Vegetables Vegetables cooked in cheese, cream, or butter sauce. Fried vegetables. Grains Breads that are made with saturated or trans fats, oils, or whole milk. Croissants. Sweet rolls. Donuts. High-fat crackers, such as cheese crackers. Meats and other proteins Fatty meats, such as hot dogs, ribs, sausage, bacon, rib-eye roast or steak. High-fat deli meats, such as salami and bologna. Caviar. Domestic duck and goose. Organ meats, such as liver. Dairy Cream, sour cream, cream cheese, and creamed cottage cheese. Whole-milk cheeses. Whole or 2% milk that is liquid, evaporated, or condensed. Whole buttermilk. Cream sauce or high-fat cheese sauce. Yogurt that is made from whole milk. Fats and oils Meat fat, or shortening. Cocoa butter, hydrogenated oils, palm oil, coconut oil, palm kernel oil. Solid fats and shortenings, including bacon fat, salt pork, lard, and butter. Nondairy cream substitutes. Salad dressings with cheese or sour cream. Beverages Regular sodas and juice drinks with added sugar. Sweets and desserts Frosting. Pudding. Cookies. Cakes. Pies. Milk chocolate or white chocolate. Buttered syrups. Full-fat ice cream or ice cream drinks. The items listed above may not be a complete list of foods and drinks to avoid. Contact a dietitian for more information. Summary  Heart-healthy meal planning includes eating less unhealthy fats, eating more healthy fats, and making other changes in your diet.  Eat  a balanced diet. This includes fruits and vegetables, low-fat or nonfat dairy, lean protein, nuts and legumes, whole grains, and heart-healthy oils and fats. This information is not intended to replace advice given to you by your health care provider. Make sure you discuss any questions you have with your health care provider. Document Revised: 08/28/2017 Document Reviewed: 08/01/2017 Elsevier Patient Education  2021 [employer] Maintenance, Female Adopting a healthy lifestyle and getting preventive care are important in promoting health and wellness. Ask your health care provider about:  The right schedule for you to have regular tests and exams.  Things you can do on your own to prevent diseases and keep yourself healthy. What should I know about diet, weight, and exercise? Eat a healthy diet  Eat a diet that includes plenty of vegetables, fruits, low-fat dairy products, and lean protein.  Do not eat a lot of foods that are high in solid fats, added sugars, or sodium.   Maintain a healthy weight Body mass index (BMI) is used to identify weight problems. It estimates body fat based on height and weight. Your health care provider can help determine your BMI and help you achieve or maintain a healthy weight. Get regular exercise Get regular exercise. This is one of the most important things you can do for your  health. Most adults should:  Exercise for at least 150 minutes each week. The exercise should increase your heart rate and make you sweat (moderate-intensity exercise).  Do strengthening exercises at least twice a week. This is in addition to the moderate-intensity exercise.  Spend less time sitting. Even light physical activity can be beneficial. Watch cholesterol and blood lipids Have your blood tested for lipids and cholesterol at 72 years of age, then have this test every 5 years. Have your cholesterol levels checked more often if:  Your lipid or cholesterol  levels are high.  You are older than 72 years of age.  You are at high risk for heart disease. What should I know about cancer screening? Depending on your health history and family history, you may need to have cancer screening at various ages. This may include screening for:  Breast cancer.  Cervical cancer.  Colorectal cancer.  Skin cancer.  Lung cancer. What should I know about heart disease, diabetes, and high blood pressure? Blood pressure and heart disease  High blood pressure causes heart disease and increases the risk of stroke. This is more likely to develop in people who have high blood pressure readings, are of African descent, or are overweight.  Have your blood pressure checked: ? Every 3-5 years if you are 33-71 years of age. ? Every year if you are 53 years old or older. Diabetes Have regular diabetes screenings. This checks your fasting blood sugar level. Have the screening done:  Once every three years after age 40 if you are at a normal weight and have a low risk for diabetes.  More often and at a younger age if you are overweight or have a high risk for diabetes. What should I know about preventing infection? Hepatitis B If you have a higher risk for hepatitis B, you should be screened for this virus. Talk with your health care provider to find out if you are at risk for hepatitis B infection. Hepatitis C Testing is recommended for:  Everyone born from [birth year] through 1965.  Anyone with known risk factors for hepatitis C. Sexually transmitted infections (STIs)  Get screened for STIs, including gonorrhea and chlamydia, if: ? You are sexually active and are younger than 72 years of age. ? You are older than 72 years of age and your health care provider tells you that you are at risk for this type of infection. ? Your sexual activity has changed since you were last screened, and you are at increased risk for chlamydia or gonorrhea. Ask your health care  provider if you are at risk.  Ask your health care provider about whether you are at high risk for HIV. Your health care provider may recommend a prescription medicine to help prevent HIV infection. If you choose to take medicine to prevent HIV, you should first get tested for HIV. You should then be tested every 3 months for as long as you are taking the medicine. Pregnancy  If you are about to stop having your period (premenopausal) and you may become pregnant, seek counseling before you get pregnant.  Take 400 to 800 micrograms (mcg) of folic acid every day if you become pregnant.  Ask for birth control (contraception) if you want to prevent pregnancy. Osteoporosis and menopause Osteoporosis is a disease in which the bones lose minerals and strength with aging. This can result in bone fractures. If you are 37 years old or older, or if you are at risk for osteoporosis and fractures,  ask your health care provider if you should:  Be screened for bone loss.  Take a calcium or vitamin D supplement to lower your risk of fractures.  Be given hormone replacement therapy (HRT) to treat symptoms of menopause. Follow these instructions at home: Lifestyle  Do not use any products that contain nicotine or tobacco, such as cigarettes, e-cigarettes, and chewing tobacco. If you need help quitting, ask your health care provider.  Do not use street drugs.  Do not share needles.  Ask your health care provider for help if you need support or information about quitting drugs. Alcohol use  Do not drink alcohol if: ? Your health care provider tells you not to drink. ? You are pregnant, may be pregnant, or are planning to become pregnant.  If you drink alcohol: ? Limit how much you use to 0-1 drink a day. ? Limit intake if you are breastfeeding.  Be aware of how much alcohol is in your drink. In the [place] one drink equals one 12 oz bottle of beer (355 mL), one 5 oz glass of wine (148 mL), or one 1  oz glass of hard liquor (44 mL). General instructions  Schedule regular health, dental, and eye exams.  Stay current with your vaccines.  Tell your health care provider if: ? You often feel depressed. ? You have ever been abused or do not feel safe at home. Summary  Adopting a healthy lifestyle and getting preventive care are important in promoting health and wellness.  Follow your health care provider's instructions about healthy diet, exercising, and getting tested or screened for diseases.  Follow your health care provider's instructions on monitoring your cholesterol and blood pressure. This information is not intended to replace advice given to you by your health care provider. Make sure you discuss any questions you have with your health care provider. Document Revised: 06/17/2018 Document Reviewed: 06/17/2018 Elsevier Patient Education  2021 [employer].

## 2020-10-16 NOTE — Assessment & Plan Note (Signed)
Checking DEXA which is first after starting fosamax 2019. We discussed today either change if worsening bone density, 5 year treatment and then stopping with monitoring 2 years post-stopping if stable or improving bone density.

## 2020-10-18 ENCOUNTER — Ambulatory Visit (INDEPENDENT_AMBULATORY_CARE_PROVIDER_SITE_OTHER): Payer: Self-pay | Admitting: Plastic Surgery

## 2020-10-18 ENCOUNTER — Encounter: Payer: Self-pay | Admitting: Plastic Surgery

## 2020-10-18 ENCOUNTER — Other Ambulatory Visit: Payer: Self-pay

## 2020-10-18 ENCOUNTER — Other Ambulatory Visit (HOSPITAL_COMMUNITY)
Admission: RE | Admit: 2020-10-18 | Discharge: 2020-10-18 | Disposition: A | Discharge status: Home / Self Care | Payer: Medicare Other | Source: Ambulatory Visit | Attending: Plastic Surgery | Admitting: Plastic Surgery

## 2020-10-18 VITALS — BP 145/90 | HR 96

## 2020-10-18 DIAGNOSIS — L989 Disorder of the skin and subcutaneous tissue, unspecified: Secondary | ICD-10-CM | POA: Diagnosis not present

## 2020-10-18 DIAGNOSIS — L988 Other specified disorders of the skin and subcutaneous tissue: Secondary | ICD-10-CM | POA: Diagnosis not present

## 2020-10-18 DIAGNOSIS — Z411 Encounter for cosmetic surgery: Secondary | ICD-10-CM

## 2020-10-18 DIAGNOSIS — L820 Inflamed seborrheic keratosis: Secondary | ICD-10-CM | POA: Diagnosis not present

## 2020-10-18 NOTE — Progress Notes (Signed)
Operative Note   DATE OF OPERATION: 10/18/2020  LOCATION:    SURGICAL DEPARTMENT: Plastic Surgery  PREOPERATIVE DIAGNOSES: Left earlobe tear and left earlobe skin lesion  POSTOPERATIVE DIAGNOSES:  same  PROCEDURE:  1. Excision of left earlobe skin lesion measuring 1.5 cm 2. Cosmetic repair of left earlobe tear  SURGEON: Talmadge Coventry, MD  ANESTHESIA:  Local  COMPLICATIONS: None.   INDICATIONS FOR PROCEDURE:  The patient, Jennifer Hopkins is a 70 y.o. female born on Jan 09, 1951, is here for treatment of left earlobe skin lesion and left earlobe tear MRN: [id number]  CONSENT:  Informed consent was obtained directly from the patient. Risks, benefits and alternatives were fully discussed. Specific risks including but not limited to bleeding, infection, hematoma, seroma, scarring, pain, infection, wound healing problems, and need for further surgery were all discussed. The patient did have an ample opportunity to have questions answered to satisfaction.   DESCRIPTION OF PROCEDURE:  Local anesthesia was administered. The patient's operative site was prepped and draped in a sterile fashion. A time out was performed and all information was confirmed to be correct.  The lesion was excised with a 15 blade.  Hemostasis was obtained.  The other side of the earlobe tear was then excised and freshened up with a 11 blade.  The 2 ends were then sutured together to make a congruent earlobe.  The lesion was passed off to head to pathology.  Closure was done with combination of interrupted and mattress 5-0 Monocryl sutures.  The patient tolerated the procedure well.  There were no complications.

## 2020-10-20 ENCOUNTER — Other Ambulatory Visit: Payer: Self-pay | Admitting: Internal Medicine

## 2020-10-23 LAB — SURGICAL PATHOLOGY

## 2020-10-25 ENCOUNTER — Ambulatory Visit (INDEPENDENT_AMBULATORY_CARE_PROVIDER_SITE_OTHER)
Admission: RE | Admit: 2020-10-25 | Discharge: 2020-10-25 | Disposition: A | Discharge status: Home / Self Care | Payer: Medicare Other | Source: Ambulatory Visit | Attending: Internal Medicine | Admitting: Internal Medicine

## 2020-10-25 ENCOUNTER — Other Ambulatory Visit: Payer: Self-pay

## 2020-10-25 DIAGNOSIS — M810 Age-related osteoporosis without current pathological fracture: Secondary | ICD-10-CM | POA: Diagnosis not present

## 2020-10-29 DIAGNOSIS — M810 Age-related osteoporosis without current pathological fracture: Secondary | ICD-10-CM | POA: Diagnosis not present

## 2020-11-09 ENCOUNTER — Other Ambulatory Visit: Payer: Self-pay

## 2020-11-09 ENCOUNTER — Ambulatory Visit (INDEPENDENT_AMBULATORY_CARE_PROVIDER_SITE_OTHER): Payer: Self-pay | Admitting: Surgical

## 2020-11-09 DIAGNOSIS — Z411 Encounter for cosmetic surgery: Secondary | ICD-10-CM

## 2020-11-09 DIAGNOSIS — L989 Disorder of the skin and subcutaneous tissue, unspecified: Secondary | ICD-10-CM

## 2020-11-09 NOTE — Progress Notes (Signed)
Patient is a 70 year old female here for follow-up after excision of left earlobe skin lesion and cosmetic repair of left earlobe tear with Dr. Claudia Desanctis on 10/18/2020.  She is 3 weeks postop from this.  She reports that she is overall doing well.  We reviewed the pathology report which showed an irritated seborrheic keratoses.  Benign hyperplasia.  On exam left ear incision is intact and healing nicely.  No erythema.  No dehiscence noted.  Sutures were removed, Steri-Strip was placed.  Patient tolerated this well.  All of her questions were answered to her content. Recommend following up in 3 months for piercing of the left ear.

## 2021-02-07 ENCOUNTER — Ambulatory Visit (INDEPENDENT_AMBULATORY_CARE_PROVIDER_SITE_OTHER): Payer: Self-pay | Admitting: Plastic Surgery

## 2021-02-07 ENCOUNTER — Other Ambulatory Visit: Payer: Self-pay

## 2021-02-07 DIAGNOSIS — Z719 Counseling, unspecified: Secondary | ICD-10-CM

## 2021-02-07 DIAGNOSIS — Z411 Encounter for cosmetic surgery: Secondary | ICD-10-CM

## 2021-02-07 NOTE — Progress Notes (Signed)
Patient presents for piercing of her left ear.  I did a earlobe repair and excision several weeks ago and this is healed nicely.  I picked out a new spot for the piercing and ensured symmetry with the other side which she confirmed.  The area was then prepped with an alcohol pad a piercing gun was used to perform the piercing.  She tolerated this well.  I gave her instructions on how to manage it.  We will plan to see her again as needed.

## 2021-03-07 ENCOUNTER — Other Ambulatory Visit: Payer: Self-pay | Admitting: Internal Medicine

## 2021-03-07 DIAGNOSIS — Z1231 Encounter for screening mammogram for malignant neoplasm of breast: Secondary | ICD-10-CM

## 2021-04-13 ENCOUNTER — Ambulatory Visit
Admission: RE | Admit: 2021-04-13 | Discharge: 2021-04-13 | Disposition: A | Discharge status: Home / Self Care | Payer: Medicare Other | Source: Ambulatory Visit | Attending: Internal Medicine | Admitting: Internal Medicine

## 2021-04-13 ENCOUNTER — Other Ambulatory Visit: Payer: Self-pay

## 2021-04-13 DIAGNOSIS — Z1231 Encounter for screening mammogram for malignant neoplasm of breast: Secondary | ICD-10-CM | POA: Diagnosis not present

## 2021-04-25 NOTE — Progress Notes (Signed)
Patient Care Team: Hoyt Koch, MD as PCP - General (Internal Medicine)  DIAGNOSIS:    ICD-10-CM   1. 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, unspecified estrogen receptor status ([place])  C50.311       SUMMARY OF ONCOLOGIC HISTORY: Oncology History  Breast cancer, right breast ([place])  10/16/2000 Initial Diagnosis   Mammogram revealed mass in the right breast biopsy showed IDC with DCIS ER 90% PR 81% Ki-67 6% HER-2 negative   11/10/2000 Surgery   Sentinel lymph node study for lymph nodes negative   12/08/2000 - 01/28/2001 Neo-Adjuvant Chemotherapy   4 cycles of Adriamycin and Taxotere   02/06/2001 Surgery   Lumpectomy showed residual IMC and DCIS 1.5 cm   03/10/2001 - 04/20/2001 Chemotherapy   CMF chemotherapy    Radiation Therapy   Radiation therapy to the right breast   05/10/2001 - 05/10/2006 Anti-estrogen oral therapy   Tamoxifen 20 mg daily for 5 years   01/09/2010 Relapse/Recurrence   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 Sondra Come     CHIEF COMPLIANT: Surveillance of breast cancer  INTERVAL HISTORY: Jennifer Hopkins is a 70 y.o. with above-mentioned history of left breast cancer who is currently on surveillance. Mammogram on 04/13/2021 showed no evidence of malignancy bilaterally. She presents to the clinic today for follow-up.  She is now a 20-year breast cancer survivor.  Denies any lumps or nodules in the breast.  Mammograms have been coming up pretty normal.  She is currently on Fosamax for osteoporosis and tolerating it well.  ALLERGIES:  has No Known Allergies.  MEDICATIONS:  Current Outpatient Medications  Medication Sig Dispense Refill   alendronate (FOSAMAX) 70 MG  tablet TAKE 1 TABLET BY MOUTH EVERY 7 DAYS WITH A FULL GLASS OF WATER ON AN EMPTY STOMACH 12 tablet 3   calcium-vitamin D (OSCAL WITH D) 250-125 MG-UNIT per tablet Take 1 tablet by mouth daily.     cholecalciferol (VITAMIN D) 1000 UNITS tablet Take 1,000 Units by mouth daily. Pt.  Not sure of dosage     co-enzyme Q-10 30 MG capsule Take 30 mg by mouth 3 (three) times daily.     diphenhydrAMINE (SOMINEX) 25 MG tablet Take 25 mg by mouth at bedtime as needed. (Patient not taking: Reported on 02/07/2021)     fexofenadine (ALLEGRA) 30 MG tablet Take 30 mg by mouth daily.     Flaxseed, Linseed, (FLAX SEED OIL PO) Take by mouth.     ibuprofen (ADVIL,MOTRIN) 200 MG tablet Take 200 mg by mouth every 6 (six) hours as needed (pain).  (Patient not taking: Reported on 02/07/2021)     Multiple Vitamin (MULTIVITAMIN) tablet Take 1 tablet by mouth daily.     pravastatin (PRAVACHOL) 40 MG tablet TAKE 1 TABLET BY MOUTH EVERY DAY 90 tablet 3   pseudoephedrine (SUDAFED) 30 MG tablet Take 30 mg by mouth every 4 (four) hours as needed. Reported on 08/02/2015 (Patient not taking: Reported on 02/07/2021)     vitamin C (VITAMIN C) 1000 MG tablet Take 1 tablet (1,000 mg total) by mouth daily. 120 tablet 0   No current facility-administered medications for this visit.    PHYSICAL EXAMINATION: ECOG PERFORMANCE STATUS: 1 - Symptomatic but completely  ambulatory  Vitals:   04/26/21 1029  BP: (!) 152/87  Pulse: 92  Resp: 18  Temp: 98.1 F (36.7 C)  SpO2: 98%   Filed Weights   04/26/21 1029  Weight: 129 lb 14.4 oz (58.9 kg)    BREAST: No palpable masses or nodules in either right or left breasts. No palpable axillary supraclavicular or infraclavicular adenopathy no breast tenderness or nipple discharge. (exam performed in the presence of a chaperone)  LABORATORY DATA:  I have reviewed the data as listed CMP Latest Ref Rng & Units 10/16/2020 10/15/2019 08/31/2018  Glucose 70 - 99 mg/dL 89 98 98  BUN 6 - 23 mg/dL _0 Creatinine 0.40 - 1.20 mg/dL 0.70 0.63 0.68  Sodium 135 - 145 mEq/L 141 140 140  Potassium 3.5 - 5.1 mEq/L 3.3(L) 3.2(L) 3.5  Chloride 96 - 112 mEq/L 100 104 103  CO2 19 - 32 mEq/L 34(H) 27 29  Calcium 8.4 - 10.5 mg/dL 9.8 9.4 9.4  Total Protein 6.0 - 8.3 g/dL 7.6 7.3 7.6  Total Bilirubin 0.2 - 1.2 mg/dL 1.2 1.1 0.9  Alkaline Phos 39 - 117 U/L 59 58 59  AST 0 - 37 U/L _1 ALT 0 - 35 U/L _2 Lab Results  Component Value Date   WBC 8.2 10/16/2020   HGB 13.6 10/16/2020   HCT 39.9 10/16/2020   MCV 87.1 10/16/2020   PLT 352.0 10/16/2020   NEUTROABS 6.0 03/10/2014    ASSESSMENT & PLAN:  Breast cancer, right breast Right breast cancer diagnosed in 2002 and treated with neoadjuvant chemotherapy followed by surgery followed by additional adjuvant chemotherapy followed by radiation and antiestrogen therapy that was completed in 2007.   Breast Cancer Surveillance: 1. Breast exam 04/26/2021: Benign 2. Mammogram 04/18/2021 benign. Postsurgical changes. Breast Density Category C.    Bone density 10/25/2020: T score -2.6: Osteoporosis: Recommend bisphosphonate therapy patient loves to travel She will travel to [place] as well as [place] last year.  She is looking forward to more travels this year. Return to clinic in 1 year to follow with me    No orders of the defined types were placed in this encounter.  The patient has a good understanding of the overall plan. she agrees with it. she will call with any problems that may develop before the next visit here.  Total time spent: 20 mins including face to face time and time spent for planning, charting and coordination of care  Rulon Eisenmenger, MD, MPH 04/26/2021  I, Thana Ates, am acting as scribe for Dr. Nicholas Lose.  I have reviewed the above documentation for accuracy and completeness, and I agree with the above.

## 2021-04-26 ENCOUNTER — Other Ambulatory Visit: Payer: Self-pay

## 2021-04-26 ENCOUNTER — Inpatient Hospital Stay: Payer: Medicare Other | Attending: Hematology and Oncology | Admitting: Hematology and Oncology

## 2021-04-26 DIAGNOSIS — C50311 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: Secondary | ICD-10-CM | POA: Diagnosis not present

## 2021-04-26 DIAGNOSIS — Z853 Personal history of malignant neoplasm of breast: Secondary | ICD-10-CM | POA: Insufficient documentation

## 2021-04-26 NOTE — Assessment & Plan Note (Signed)
Right breast cancer diagnosed in 2002 and treated with neoadjuvant chemotherapy followed by surgery followed by additional adjuvant chemotherapy followed by radiation and antiestrogen therapy that was completed in 2007.  Breast Cancer Surveillance: 1. Breast exam10/20/2022: Benign 2. Mammogram10/12/2022benign. Postsurgical changes. Breast Density Category C.   Bone density 10/25/2020: T score -2.6: Osteoporosis: Recommend bisphosphonate therapy patient loves to travel Return to clinic in 1 year to follow withme

## 2021-08-08 ENCOUNTER — Other Ambulatory Visit: Payer: Self-pay | Admitting: Internal Medicine

## 2021-10-17 ENCOUNTER — Other Ambulatory Visit: Payer: Self-pay | Admitting: Internal Medicine

## 2021-11-02 ENCOUNTER — Ambulatory Visit (INDEPENDENT_AMBULATORY_CARE_PROVIDER_SITE_OTHER): Payer: Medicare Other

## 2021-11-02 DIAGNOSIS — Z0000 Encounter for general adult medical examination without abnormal findings: Secondary | ICD-10-CM | POA: Diagnosis not present

## 2021-11-02 DIAGNOSIS — Z1211 Encounter for screening for malignant neoplasm of colon: Secondary | ICD-10-CM | POA: Diagnosis not present

## 2021-11-02 NOTE — Patient Instructions (Signed)
[name] , ?Thank you for taking time to come for your Medicare Wellness Visit. I appreciate your ongoing commitment to your health goals. Please review the following plan we discussed and let me know if I can assist you in the future.  ? ?Screening recommendations/referrals: ?Colonoscopy: referral 11/02/2021 ?Mammogram: 04/13/2021 ?Bone Density: 10/25/2020 ?Recommended yearly ophthalmology/optometry visit for glaucoma screening and checkup ?Recommended yearly dental visit for hygiene and checkup ? ?Vaccinations: ?Influenza vaccine: completed  ?Pneumococcal vaccine: completed  ?Tdap vaccine: 10/13/2014 ?Shingles vaccine: completed    ? ?Advanced directives: none ? ?Conditions/risks identified: none ? ?Next appointment: none ? ? ?Preventive Care 73 Years and Older, Female ?Preventive care refers to lifestyle choices and visits with your health care provider that can promote health and wellness. ?What does preventive care include? ?A yearly physical exam. This is also called an annual well check. ?Dental exams once or twice a year. ?Routine eye exams. Ask your health care provider how often you should have your eyes checked. ?Personal lifestyle choices, including: ?Daily care of your teeth and gums. ?Regular physical activity. ?Eating a healthy diet. ?Avoiding tobacco and drug use. ?Limiting alcohol use. ?Practicing safe sex. ?Taking low-dose aspirin every day. ?Taking vitamin and mineral supplements as recommended by your health care provider. ?What happens during an annual well check? ?The services and screenings done by your health care provider during your annual well check will depend on your age, overall health, lifestyle risk factors, and family history of disease. ?Counseling  ?Your health care provider may ask you questions about your: ?Alcohol use. ?Tobacco use. ?Drug use. ?Emotional well-being. ?Home and relationship well-being. ?Sexual activity. ?Eating habits. ?History of falls. ?Memory and ability to  understand (cognition). ?Work and work Statistician. ?Reproductive health. ?Screening  ?You may have the following tests or measurements: ?Height, weight, and BMI. ?Blood pressure. ?Lipid and cholesterol levels. These may be checked every 5 years, or more frequently if you are over 69 years old. ?Skin check. ?Lung cancer screening. You may have this screening every year starting at age 48 if you have a 30-pack-year history of smoking and currently smoke or have quit within the past 15 years. ?Fecal occult blood test (FOBT) of the stool. You may have this test every year starting at age 76. ?Flexible sigmoidoscopy or colonoscopy. You may have a sigmoidoscopy every 5 years or a colonoscopy every 10 years starting at age 33. ?Hepatitis C blood test. ?Hepatitis B blood test. ?Sexually transmitted disease (STD) testing. ?Diabetes screening. This is done by checking your blood sugar (glucose) after you have not eaten for a while (fasting). You may have this done every 1-3 years. ?Bone density scan. This is done to screen for osteoporosis. You may have this done starting at age 64. ?Mammogram. This may be done every 1-2 years. Talk to your health care provider about how often you should have regular mammograms. ?Talk with your health care provider about your test results, treatment options, and if necessary, the need for more tests. ?Vaccines  ?Your health care provider may recommend certain vaccines, such as: ?Influenza vaccine. This is recommended every year. ?Tetanus, diphtheria, and acellular pertussis (Tdap, Td) vaccine. You may need a Td booster every 10 years. ?Zoster vaccine. You may need this after age 6. ?Pneumococcal 13-valent conjugate (PCV13) vaccine. One dose is recommended after age 58. ?Pneumococcal polysaccharide (PPSV23) vaccine. One dose is recommended after age 31. ?Talk to your health care provider about which screenings and vaccines you need and how often you need them. ?  This information is not  intended to replace advice given to you by your health care provider. Make sure you discuss any questions you have with your health care provider. ?Document Released: 07/21/2015 Document Revised: 03/13/2016 Document Reviewed: 04/25/2015 ?Elsevier Interactive Patient Education ? 2017 [employer]. ? ?Fall Prevention in the Home ?Falls can cause injuries. They can happen to people of all ages. There are many things you can do to make your home safe and to help prevent falls. ?What can I do on the outside of my home? ?Regularly fix the edges of walkways and driveways and fix any cracks. ?Remove anything that might make you trip as you walk through a door, such as a raised step or threshold. ?Trim any bushes or trees on the path to your home. ?Use bright outdoor lighting. ?Clear any walking paths of anything that might make someone trip, such as rocks or tools. ?Regularly check to see if handrails are loose or broken. Make sure that both sides of any steps have handrails. ?Any raised decks and porches should have guardrails on the edges. ?Have any leaves, snow, or ice cleared regularly. ?Use sand or salt on walking paths during winter. ?Clean up any spills in your garage right away. This includes oil or grease spills. ?What can I do in the bathroom? ?Use night lights. ?Install grab bars by the toilet and in the tub and shower. Do not use towel bars as grab bars. ?Use non-skid mats or decals in the tub or shower. ?If you need to sit down in the shower, use a plastic, non-slip stool. ?Keep the floor dry. Clean up any water that spills on the floor as soon as it happens. ?Remove soap buildup in the tub or shower regularly. ?Attach bath mats securely with double-sided non-slip rug tape. ?Do not have throw rugs and other things on the floor that can make you trip. ?What can I do in the bedroom? ?Use night lights. ?Make sure that you have a light by your bed that is easy to reach. ?Do not use any sheets or blankets that are  too big for your bed. They should not hang down onto the floor. ?Have a firm chair that has side arms. You can use this for support while you get dressed. ?Do not have throw rugs and other things on the floor that can make you trip. ?What can I do in the kitchen? ?Clean up any spills right away. ?Avoid walking on wet floors. ?Keep items that you use a lot in easy-to-reach places. ?If you need to reach something above you, use a strong step stool that has a grab bar. ?Keep electrical cords out of the way. ?Do not use floor polish or wax that makes floors slippery. If you must use wax, use non-skid floor wax. ?Do not have throw rugs and other things on the floor that can make you trip. ?What can I do with my stairs? ?Do not leave any items on the stairs. ?Make sure that there are handrails on both sides of the stairs and use them. Fix handrails that are broken or loose. Make sure that handrails are as long as the stairways. ?Check any carpeting to make sure that it is firmly attached to the stairs. Fix any carpet that is loose or worn. ?Avoid having throw rugs at the top or bottom of the stairs. If you do have throw rugs, attach them to the floor with carpet tape. ?Make sure that you have a light switch at the  top of the stairs and the bottom of the stairs. If you do not have them, ask someone to add them for you. ?What else can I do to help prevent falls? ?Wear shoes that: ?Do not have high heels. ?Have rubber bottoms. ?Are comfortable and fit you well. ?Are closed at the toe. Do not wear sandals. ?If you use a stepladder: ?Make sure that it is fully opened. Do not climb a closed stepladder. ?Make sure that both sides of the stepladder are locked into place. ?Ask someone to hold it for you, if possible. ?Clearly mark and make sure that you can see: ?Any grab bars or handrails. ?First and last steps. ?Where the edge of each step is. ?Use tools that help you move around (mobility aids) if they are needed. These  include: ?Canes. ?Walkers. ?Scooters. ?Crutches. ?Turn on the lights when you go into a dark area. Replace any light bulbs as soon as they burn out. ?Set up your furniture so you have a clear path. Avoid moving yo

## 2021-11-02 NOTE — Progress Notes (Signed)
? ?Subjective:  ? Jennifer Hopkins is a 71 y.o. female who presents for Medicare Annual (Subsequent) preventive examination. ? ? ?I connected with Alphia Moh today by telephone and verified that I am speaking with the correct person using two identifiers. ?Location patient: home ?Location provider: work ?Persons participating in the virtual visit: patient, provider. ?  ?I discussed the limitations, risks, security and privacy concerns of performing an evaluation and management service by telephone and the availability of in person appointments. I also discussed with the patient that there may be a patient responsible charge related to this service. The patient expressed understanding and verbally consented to this telephonic visit.  ?  ?Interactive audio and video telecommunications were attempted between this provider and patient, however failed, due to patient having technical difficulties OR patient did not have access to video capability.  We continued and completed visit with audio only. ? ?  ?Review of Systems    ? ?Cardiac Risk Factors include: advanced age (>48mn, >>56women) ? ?   ?Objective:  ?  ?Today's Vitals  ? ?There is no height or weight on file to calculate BMI. ? ? ?  11/02/2021  ? 10:11 AM 03/14/2016  ?  8:44 AM 03/16/2015  ?  9:12 AM 08/17/2013  ?  9:00 PM  ?Advanced Directives  ?Does Patient Have a Medical Advance Directive? No No No Patient does not have advance directive  ?Would patient like information on creating a medical advance directive? No - Patient declined  No - patient declined information   ?Pre-existing out of facility DNR order (yellow form or pink MOST form)    No  ? ? ?Current Medications (verified) ?Outpatient Encounter Medications as of 11/02/2021  ?Medication Sig  ? alendronate (FOSAMAX) 70 MG tablet TAKE 1 TABLET BY MOUTH EVERY 7 DAYS WITH A FULL GLASS OF WATER ON AN EMPTY STOMACH  ? calcium-vitamin D (OSCAL WITH D) 250-125 MG-UNIT per tablet Take 1 tablet by mouth daily.  ?  cholecalciferol (VITAMIN D) 1000 UNITS tablet Take 1,000 Units by mouth daily. Pt.  Not sure of dosage  ? co-enzyme Q-10 30 MG capsule Take 30 mg by mouth 3 (three) times daily.  ? diphenhydrAMINE (SOMINEX) 25 MG tablet Take 25 mg by mouth at bedtime as needed.  ? fexofenadine (ALLEGRA) 30 MG tablet Take 30 mg by mouth daily.  ? Flaxseed, Linseed, (FLAX SEED OIL PO) Take by mouth.  ? ibuprofen (ADVIL,MOTRIN) 200 MG tablet Take 200 mg by mouth every 6 (six) hours as needed (pain).  ? Multiple Vitamin (MULTIVITAMIN) tablet Take 1 tablet by mouth daily.  ? pravastatin (PRAVACHOL) 40 MG tablet TAKE 1 TABLET BY MOUTH EVERY DAY  ? pseudoephedrine (SUDAFED) 30 MG tablet Take 30 mg by mouth every 4 (four) hours as needed. Reported on 08/02/2015  ? vitamin C (VITAMIN C) 1000 MG tablet Take 1 tablet (1,000 mg total) by mouth daily.  ? ?No facility-administered encounter medications on file as of 11/02/2021.  ? ? ?Allergies (verified) ?Patient has no known allergies.  ? ?History: ?Past Medical History:  ?Diagnosis Date  ? Allergy   ? Cancer ([facility]   ? Breast  ? Rheumatic fever   ? ?Past Surgical History:  ?Procedure Laterality Date  ? BASAL CELL CARCINOMA EXCISION  12/2009  ? BREAST LUMPECTOMY Right 02/2001  ? [facility] [place] [facility]  [facility] ? OPEN REDUCTION INTERNAL FIXATION (ORIF) DISTAL RADIAL FRACTURE Left 08/17/2013  ? Procedure: OPEN REDUCTION INTERNAL FIXATION (ORIF)  DISTAL RADIAL FRACTURE;  Surgeon: Roseanne Kaufman, MD;  Location: [place];  Service: Orthopedics;  Laterality: Left;  ? ?Family History  ?Problem Relation Age of Onset  ? Cancer Mother   ?     Breast Cancer  ? Hyperlipidemia Father   ? Heart disease Father   ? Hypertension Father   ? Heart disease Paternal Aunt   ? Heart disease Paternal Uncle   ? Heart disease Paternal Grandmother   ? Heart disease Paternal Grandfather   ? ?Social History  ? ?Socioeconomic History  ? Marital status: Divorced  ?  Spouse name: Not on file  ? Number of children: Not on file  ? Years  of education: Not on file  ? Highest education level: Not on file  ?Occupational History  ? Not on file  ?Tobacco Use  ? Smoking status: Never  ? Smokeless tobacco: Never  ?Substance and Sexual Activity  ? Alcohol use: Yes  ?  Comment: occas  ? Drug use: No  ? Sexual activity: Yes  ?Other Topics Concern  ? Not on file  ?Social History Narrative  ? DIVORCED  ? 2 CHILDREN  ?   ?   ? ?Social Determinants of Health  ? ?Financial Resource Strain: Low Risk   ? Difficulty of Paying Living Expenses: Not hard at all  ?Food Insecurity: No Food Insecurity  ? Worried About Charity fundraiser in the Last Year: Never true  ? Ran Out of Food in the Last Year: Never true  ?Transportation Needs: No Transportation Needs  ? Lack of Transportation (Medical): No  ? Lack of Transportation (Non-Medical): No  ?Physical Activity: Insufficiently Active  ? Days of Exercise per Week: 3 days  ? Minutes of Exercise per Session: 30 min  ?Stress: No Stress Concern Present  ? Feeling of Stress : Not at all  ?Social Connections: Moderately Isolated  ? Frequency of Communication with Friends and Family: Three times a week  ? Frequency of Social Gatherings with Friends and Family: Three times a week  ? Attends Religious Services: 1 to 4 times per year  ? Active Member of Clubs or Organizations: No  ? Attends Archivist Meetings: Never  ? Marital Status: Divorced  ? ? ?Tobacco Counseling ?Counseling given: Not Answered ? ? ?Clinical Intake: ? ?Pre-visit preparation completed: Yes ? ?Pain : No/denies pain ? ?  ? ?Nutritional Risks: None ?Diabetes: No ? ?How often do you need to have someone help you when you read instructions, pamphlets, or other written materials from your doctor or pharmacy?: 1 - Never ?What is the last grade level you completed in school?: college ? ?Diabetic?no  ? ?Interpreter Needed?: No ? ?Information entered by :: [email or web address] ? ? ?Activities of Daily Living ? ?  11/02/2021  ? 10:13 AM  ?In your present state of health,  do you have any difficulty performing the following activities:  ?Hearing? 0  ?Vision? 0  ?Difficulty concentrating or making decisions? 0  ?Walking or climbing stairs? 0  ?Dressing or bathing? 0  ?Doing errands, shopping? 0  ?Preparing Food and eating ? N  ?Using the Toilet? N  ?In the past six months, have you accidently leaked urine? N  ?Do you have problems with loss of bowel control? N  ?Managing your Medications? N  ?Managing your Finances? N  ?Housekeeping or managing your Housekeeping? N  ? ? ?Patient Care Team: ?Hoyt Koch, MD as PCP - General (Internal Medicine) ? ?Indicate any recent Medical  Services you may have received from other than [facility] providers in the past year (date may be approximate). ? ?   ?Assessment:  ? This is a routine wellness examination for [facility]. ? ?Hearing/Vision screen ?Vision Screening - Comments:: Annual eye exams wear glasses  ? ?Dietary issues and exercise activities discussed: ?Current Exercise Habits: Home exercise routine, Type of exercise: walking, Time (Minutes): 30, Frequency (Times/Week): 3, Weekly Exercise (Minutes/Week): 90, Intensity: Mild, Exercise limited by: None identified ? ? Goals Addressed   ?None ?  ? ?Depression Screen ? ?  11/02/2021  ? 10:13 AM 11/02/2021  ? 10:09 AM 10/16/2020  ? 12:54 PM 10/15/2019  ?  2:12 PM 08/31/2018  ?  8:34 AM 12/17/2016  ? 10:11 AM 08/02/2015  ? 11:10 AM  ?PHQ 2/9 Scores  ?PHQ - 2 Score 0 0 0 0 0 0 0  ?  ?Fall Risk ? ?  11/02/2021  ? 10:12 AM 10/16/2020  ? 12:54 PM 10/15/2019  ?  2:12 PM 08/31/2018  ?  8:34 AM 12/17/2016  ? 10:11 AM  ?Fall Risk   ?Falls in the past year? 0 0 0 0 No  ?Number falls in past yr: 0 0     ?Injury with Fall? 0 0     ?Follow up Falls evaluation completed  Falls evaluation completed    ? ? ?FALL RISK PREVENTION PERTAINING TO THE HOME: ? ?Any stairs in or around the home? Yes  ?If so, are there any without handrails? No  ?Home free of loose throw rugs in walkways, pet beds, electrical cords, etc? Yes  ?Adequate  lighting in your home to reduce risk of falls? Yes  ? ?ASSISTIVE DEVICES UTILIZED TO PREVENT FALLS: ? ?Life alert? No  ?Use of a cane, walker or w/c? No  ?Grab bars in the bathroom? No  ?Shower chair or

## 2021-11-05 ENCOUNTER — Encounter: Payer: Self-pay | Admitting: Internal Medicine

## 2021-11-05 ENCOUNTER — Ambulatory Visit (INDEPENDENT_AMBULATORY_CARE_PROVIDER_SITE_OTHER): Payer: Medicare Other | Admitting: Internal Medicine

## 2021-11-05 VITALS — BP 126/72 | HR 93 | Temp 98.0°F | Ht 62.0 in | Wt 125.0 lb

## 2021-11-05 DIAGNOSIS — R030 Elevated blood-pressure reading, without diagnosis of hypertension: Secondary | ICD-10-CM | POA: Diagnosis not present

## 2021-11-05 DIAGNOSIS — Z0000 Encounter for general adult medical examination without abnormal findings: Secondary | ICD-10-CM | POA: Diagnosis not present

## 2021-11-05 DIAGNOSIS — M810 Age-related osteoporosis without current pathological fracture: Secondary | ICD-10-CM | POA: Diagnosis not present

## 2021-11-05 DIAGNOSIS — Z853 Personal history of malignant neoplasm of breast: Secondary | ICD-10-CM

## 2021-11-05 DIAGNOSIS — E785 Hyperlipidemia, unspecified: Secondary | ICD-10-CM | POA: Diagnosis not present

## 2021-11-05 LAB — COMPREHENSIVE METABOLIC PANEL
ALT: 17 U/L (ref 0–35)
AST: 29 U/L (ref 0–37)
Albumin: 4.4 g/dL (ref 3.5–5.2)
Alkaline Phosphatase: 72 U/L (ref 39–117)
BUN: 8 mg/dL (ref 6–23)
CO2: 29 mEq/L (ref 19–32)
Calcium: 9.3 mg/dL (ref 8.4–10.5)
Chloride: 101 mEq/L (ref 96–112)
Creatinine, Ser: 0.75 mg/dL (ref 0.40–1.20)
GFR: 79.1 mL/min (ref >60.00)
Glucose, Bld: 96 mg/dL (ref 70–99)
Potassium: 3.5 mEq/L (ref 3.5–5.1)
Sodium: 138 mEq/L (ref 135–145)
Total Bilirubin: 1.1 mg/dL (ref 0.2–1.2)
Total Protein: 7.9 g/dL (ref 6.0–8.3)

## 2021-11-05 LAB — LIPID PANEL
Cholesterol: 233 mg/dL — ABNORMAL HIGH (ref 0–200)
HDL: 59.9 mg/dL (ref >39.00)
LDL Cholesterol: 149 mg/dL — ABNORMAL HIGH (ref 0–99)
NonHDL: 173.13
Total CHOL/HDL Ratio: 4
Triglycerides: 123 mg/dL (ref 0.0–149.0)
VLDL: 24.6 mg/dL (ref 0.0–40.0)

## 2021-11-05 LAB — CBC
HCT: 40.3 % (ref 36.0–46.0)
Hemoglobin: 13.5 g/dL (ref 12.0–15.0)
MCHC: 33.5 g/dL (ref 30.0–36.0)
MCV: 87.5 fl (ref 78.0–100.0)
Platelets: 365 10*3/uL (ref 150.0–400.0)
RBC: 4.61 Mil/uL (ref 3.87–5.11)
RDW: 13.3 % (ref 11.5–15.5)
WBC: 7.6 10*3/uL (ref 4.0–10.5)

## 2021-11-05 NOTE — Assessment & Plan Note (Signed)
Normal today, continue yearly monitoring. ?

## 2021-11-05 NOTE — Progress Notes (Signed)
? ?  Subjective:  ? ?Patient ID: Jennifer Hopkins, female    DOB: [birth year], 71 y.o.   MRN: [id number] ? ?HPI ?The patient is here for physical. ? ?PMH, [facility], social history reviewed and updated ? ?Review of Systems  ?Constitutional: Negative.   ?HENT: Negative.    ?Eyes: Negative.   ?Respiratory:  Negative for cough, chest tightness and shortness of breath.   ?Cardiovascular:  Negative for chest pain, palpitations and leg swelling.  ?Gastrointestinal:  Negative for abdominal distention, abdominal pain, constipation, diarrhea, nausea and vomiting.  ?Musculoskeletal: Negative.   ?Skin: Negative.   ?Neurological: Negative.   ?Psychiatric/Behavioral: Negative.    ? ?Objective:  ?Physical Exam ?Constitutional:   ?   Appearance: She is well-developed.  ?HENT:  ?   Head: Normocephalic and atraumatic.  ?Cardiovascular:  ?   Rate and Rhythm: Normal rate and regular rhythm.  ?Pulmonary:  ?   Effort: Pulmonary effort is normal. No respiratory distress.  ?   Breath sounds: Normal breath sounds. No wheezing or rales.  ?Abdominal:  ?   General: Bowel sounds are normal. There is no distension.  ?   Palpations: Abdomen is soft.  ?   Tenderness: There is no abdominal tenderness. There is no rebound.  ?Musculoskeletal:  ?   Cervical back: Normal range of motion.  ?Skin: ?   General: Skin is warm and dry.  ?Neurological:  ?   Mental Status: She is alert and oriented to person, place, and time.  ?   Coordination: Coordination normal.  ? ? ?Vitals:  ? 11/05/21 1321  ?BP: 126/72  ?Pulse: 93  ?Temp: 98 ?F (36.7 ?C)  ?TempSrc: Oral  ?SpO2: 94%  ?Weight: 125 lb (56.7 kg)  ?Height: '5\' 2"'$  (1.575 m)  ? ? ?This visit occurred during the SARS-CoV-2 public health emergency.  Safety protocols were in place, including screening questions prior to the visit, additional usage of staff PPE, and extensive cleaning of exam room while observing appropriate contact time as indicated for disinfecting solutions.  ? ?Assessment & Plan:  ? ?

## 2021-11-05 NOTE — Assessment & Plan Note (Signed)
Gets yearly mammogram and up to date.  

## 2021-11-05 NOTE — Assessment & Plan Note (Addendum)
Checking CMP and vitamin D. Adjust fosamax 70 mg weekly as needed. Most recent DEXA 2022. Started fosamax in 2019 so plan to stop in 2024 and repeat DEXA 2026. ?

## 2021-11-05 NOTE — Assessment & Plan Note (Signed)
Flu shot yearly. covid-19 counseled. Pneumonia complete. Shingrix complete. Tetanus up to date. Colonoscopy she thinks up to date likely with [facility]. Mammogram up to date, pap smear aged out and dexa due 2026. Counseled about sun safety and mole surveillance. Counseled about the dangers of distracted driving. Given 10 year screening recommendations.  ? ?

## 2021-11-05 NOTE — Assessment & Plan Note (Signed)
Checking lipid panel and adjust pravastatin 40 mg daily as needed for LDL goal <130.  ?

## 2021-11-06 LAB — VITAMIN D 25 HYDROXY (VIT D DEFICIENCY, FRACTURES): VITD: 39.63 ng/mL (ref 30.00–100.00)

## 2021-12-12 ENCOUNTER — Other Ambulatory Visit: Payer: Self-pay | Admitting: Internal Medicine

## 2022-03-12 ENCOUNTER — Other Ambulatory Visit: Payer: Self-pay | Admitting: Internal Medicine

## 2022-03-12 DIAGNOSIS — Z1231 Encounter for screening mammogram for malignant neoplasm of breast: Secondary | ICD-10-CM

## 2022-03-13 ENCOUNTER — Telehealth: Payer: Self-pay | Admitting: Internal Medicine

## 2022-03-13 NOTE — Telephone Encounter (Signed)
Notified pt Dr. Sharlet Salina sent referral to [facility] in 10/2021 and recommended to call GI back and make an appt to save a spot for possible long waiting list to establish care. Pt agreed will call for an appt. To GI.

## 2022-03-13 NOTE — Telephone Encounter (Signed)
Patient stopped by and said she was trying to schedule her colonoscopy for this year and said she called [facility] because that's who she believes she has done it with it the past but they had no record of her. She was wondering if we had any record of it and could give her the info. Please call back at [phone].

## 2022-04-15 ENCOUNTER — Ambulatory Visit
Admission: RE | Admit: 2022-04-15 | Discharge: 2022-04-15 | Disposition: A | Discharge status: Home / Self Care | Payer: Medicare Other | Source: Ambulatory Visit | Attending: Internal Medicine | Admitting: Internal Medicine

## 2022-04-15 DIAGNOSIS — Z1231 Encounter for screening mammogram for malignant neoplasm of breast: Secondary | ICD-10-CM | POA: Diagnosis not present

## 2022-04-23 NOTE — Progress Notes (Signed)
Patient Care Team: Hoyt Koch, MD as PCP - General (Internal Medicine)  DIAGNOSIS:  Encounter Diagnosis  Name Primary?   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   11/10/2000 Surgery   Sentinel lymph node study for lymph nodes negative   12/08/2000 - 01/28/2001 Neo-Adjuvant Chemotherapy   4 cycles of Adriamycin and Taxotere   02/06/2001 Surgery   Lumpectomy showed residual IMC and DCIS 1.5 cm   03/10/2001 - 04/20/2001 Chemotherapy   CMF chemotherapy    Radiation Therapy   Radiation therapy to the right breast   05/10/2001 - 05/10/2006 Anti-estrogen oral therapy   Tamoxifen 20 mg daily for 5 years   01/09/2010 Relapse/Recurrence   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 Sondra Come     CHIEF COMPLIANT: Follow-up left breast cancer surveillance  INTERVAL HISTORY: Jennifer Hopkins is a 71 y.o. with above-mentioned history of left breast cancer who is currently on surveillance. She presents to the clinic for a follow-up. She reports no new issues or concerns. She states that she has been doing fine.    ALLERGIES:  has No Known Allergies.  MEDICATIONS:  Current Outpatient Medications  Medication Sig Dispense Refill   alendronate (FOSAMAX) 70 MG tablet TAKE 1 TABLET BY MOUTH EVERY 7 DAYS WITH A FULL GLASS OF WATER ON AN EMPTY STOMACH 12 tablet 3   calcium-vitamin D (OSCAL WITH D) 250-125 MG-UNIT per tablet Take 1 tablet by mouth daily.     cholecalciferol (VITAMIN D) 1000 UNITS tablet Take 1,000 Units by mouth daily. Pt.   Not sure of dosage     co-enzyme Q-10 30 MG capsule Take 30 mg by mouth 3 (three) times daily.     diphenhydrAMINE (SOMINEX) 25 MG tablet Take 25 mg by mouth at bedtime as needed.     fexofenadine (ALLEGRA) 30 MG tablet Take 30 mg by mouth daily.     Flaxseed, Linseed, (FLAX SEED OIL PO) Take by mouth.     ibuprofen (ADVIL,MOTRIN) 200 MG tablet Take 200 mg by mouth every 6 (six) hours as needed (pain).     Multiple Vitamin (MULTIVITAMIN) tablet Take 1 tablet by mouth daily.     pravastatin (PRAVACHOL) 40 MG tablet TAKE 1 TABLET BY MOUTH EVERY DAY 90 tablet 1   pseudoephedrine (SUDAFED) 30 MG tablet Take 30 mg by mouth every 4 (four) hours as needed. Reported on 08/02/2015     vitamin C (VITAMIN C) 1000 MG tablet Take 1 tablet (1,000 mg total) by mouth daily. 120 tablet 0   No current facility-administered medications for this visit.    PHYSICAL EXAMINATION: ECOG PERFORMANCE STATUS: 1 - Symptomatic but completely ambulatory  Vitals:   04/26/22 1025  BP: (!) 143/88  Pulse: (!) 101  Resp: 18  Temp: 97.8 F (36.6 C)  SpO2: 99%   Filed Weights   04/26/22 1025  Weight: 128 lb 8 oz (58.3 kg)    BREAST: No palpable masses or nodules in either right or left breasts. No palpable axillary supraclavicular or infraclavicular  adenopathy no breast tenderness or nipple discharge. (exam performed in the presence of a chaperone)  LABORATORY DATA:  I have reviewed the data as listed    Latest Ref Rng & Units 11/05/2021    1:43 PM 10/16/2020    1:24 PM 10/15/2019    2:42 PM  CMP  Glucose 70 - 99 mg/dL 96  89  98   BUN 6 - 23 mg/dL _0 Creatinine 0.40 - 1.20 mg/dL 0.75  0.70  0.63   Sodium 135 - 145 mEq/L 138  141  140   Potassium 3.5 - 5.1 mEq/L 3.5  3.3  3.2   Chloride 96 - 112 mEq/L 101  100  104   CO2 19 - 32 mEq/L 29  34  27   Calcium 8.4 - 10.5 mg/dL 9.3  9.8  9.4   Total Protein 6.0 - 8.3 g/dL 7.9  7.6  7.3   Total Bilirubin 0.2 - 1.2 mg/dL 1.1  1.2  1.1   Alkaline Phos 39 - 117  U/L 72  59  58   AST 0 - 37 U/L _1 ALT 0 - 35 U/L _2 Lab Results  Component Value Date   WBC 7.6 11/05/2021   HGB 13.5 11/05/2021   HCT 40.3 11/05/2021   MCV 87.5 11/05/2021   PLT 365.0 11/05/2021   NEUTROABS 6.0 03/10/2014    ASSESSMENT & PLAN:  HX: breast cancer Right breast cancer diagnosed in 2002 and treated with neoadjuvant chemotherapy followed by surgery followed by additional adjuvant chemotherapy followed by radiation and antiestrogen therapy that was completed in 2007.   Breast Cancer Surveillance: 1. Breast exam 04/26/2022: Benign 2. Mammogram 04/16/2022 benign. Postsurgical changes. Breast Density Category C.    Bone density 10/25/2020: T score -2.6: Osteoporosis: Continue with Fosamax (T score used to be -3.2)  patient loves to travel She is going on a river cruise on the [place] from [place] to [place].  She is looking forward to more travels this year.  Return to clinic in 1 year to follow with me    No orders of the defined types were placed in this encounter.  The patient has a good understanding of the overall plan. she agrees with it. she will call with any problems that may develop before the next visit here. Total time spent: 30 mins including face to face time and time spent for planning, charting and co-ordination of care   Harriette Ohara, MD 04/26/22    I Gardiner Coins am scribing for Dr. Lindi Adie  I have reviewed the above documentation for accuracy and completeness, and I agree with the above.

## 2022-04-26 ENCOUNTER — Inpatient Hospital Stay: Payer: Medicare Other | Attending: Hematology and Oncology | Admitting: Hematology and Oncology

## 2022-04-26 ENCOUNTER — Other Ambulatory Visit: Payer: Self-pay

## 2022-04-26 DIAGNOSIS — Z853 Personal history of malignant neoplasm of breast: Secondary | ICD-10-CM | POA: Diagnosis not present

## 2022-04-26 DIAGNOSIS — Z923 Personal history of irradiation: Secondary | ICD-10-CM | POA: Diagnosis not present

## 2022-04-26 DIAGNOSIS — Z79899 Other long term (current) drug therapy: Secondary | ICD-10-CM | POA: Diagnosis not present

## 2022-04-26 DIAGNOSIS — Z9221 Personal history of antineoplastic chemotherapy: Secondary | ICD-10-CM | POA: Insufficient documentation

## 2022-04-26 NOTE — Assessment & Plan Note (Addendum)
Right breast cancer diagnosed in 2002 and treated with neoadjuvant chemotherapy followed by surgery followed by additional adjuvant chemotherapy followed by radiation and antiestrogen therapy that was completed in 2007.  Breast Cancer Surveillance: 1. Breast exam10/20/2023:Benign 2. Mammogram10/10/2023benign. Postsurgical changes. Breast Density Category C.   Bone density 10/25/2020: T score -2.6: Osteoporosis: Continue with Fosamax (T score used to be -3.2)  patient loves to travel She is going on a river cruise on the [place] from [place] to [place].  She is looking forward to more travels this year.  Return to clinic in 1 year to follow withme

## 2022-06-13 ENCOUNTER — Other Ambulatory Visit: Payer: Self-pay | Admitting: Internal Medicine

## 2022-07-10 ENCOUNTER — Other Ambulatory Visit: Payer: Self-pay | Admitting: Internal Medicine

## 2022-07-10 DIAGNOSIS — K080 Exfoliation of teeth due to systemic causes: Secondary | ICD-10-CM | POA: Diagnosis not present

## 2022-09-12 DIAGNOSIS — R140 Abdominal distension (gaseous): Secondary | ICD-10-CM | POA: Diagnosis not present

## 2022-09-12 DIAGNOSIS — K5904 Chronic idiopathic constipation: Secondary | ICD-10-CM | POA: Diagnosis not present

## 2022-09-12 DIAGNOSIS — Z86010 Personal history of colonic polyps: Secondary | ICD-10-CM | POA: Diagnosis not present

## 2022-09-12 DIAGNOSIS — Z1211 Encounter for screening for malignant neoplasm of colon: Secondary | ICD-10-CM | POA: Diagnosis not present

## 2022-09-13 DIAGNOSIS — E782 Mixed hyperlipidemia: Secondary | ICD-10-CM | POA: Diagnosis not present

## 2022-09-13 DIAGNOSIS — Z86010 Personal history of colonic polyps: Secondary | ICD-10-CM | POA: Diagnosis not present

## 2022-09-13 DIAGNOSIS — Z1211 Encounter for screening for malignant neoplasm of colon: Secondary | ICD-10-CM | POA: Diagnosis not present

## 2022-09-13 DIAGNOSIS — R140 Abdominal distension (gaseous): Secondary | ICD-10-CM | POA: Diagnosis not present

## 2022-09-13 DIAGNOSIS — K5904 Chronic idiopathic constipation: Secondary | ICD-10-CM | POA: Diagnosis not present

## 2022-10-10 DIAGNOSIS — Z86010 Personal history of colonic polyps: Secondary | ICD-10-CM | POA: Diagnosis not present

## 2022-10-10 DIAGNOSIS — Z1211 Encounter for screening for malignant neoplasm of colon: Secondary | ICD-10-CM | POA: Diagnosis not present

## 2022-10-10 DIAGNOSIS — K5904 Chronic idiopathic constipation: Secondary | ICD-10-CM | POA: Diagnosis not present

## 2022-11-04 ENCOUNTER — Telehealth: Payer: Self-pay

## 2022-11-04 NOTE — Telephone Encounter (Signed)
Contacted Sherron Flemings Reasor to schedule their annual wellness visit. Appointment made for 11/14/22.  Agnes Lawrence, CMA (AAMA)  [facility] [phone]

## 2022-11-14 ENCOUNTER — Ambulatory Visit (INDEPENDENT_AMBULATORY_CARE_PROVIDER_SITE_OTHER): Payer: Self-pay

## 2022-11-14 VITALS — Ht 62.0 in | Wt 125.0 lb

## 2022-11-14 DIAGNOSIS — Z0000 Encounter for general adult medical examination without abnormal findings: Secondary | ICD-10-CM | POA: Diagnosis not present

## 2022-11-14 NOTE — Patient Instructions (Addendum)
[name] , Thank you for taking time to come for your Medicare Wellness Visit. I appreciate your ongoing commitment to your health goals. Please review the following plan we discussed and let me know if I can assist you in the future.   These are the goals we discussed:  Goals      My goal for 2024 is to maintain my health.        This is a list of the screening recommended for you and due dates:  Health Maintenance  Topic Date Due   Hepatitis C Screening: USPSTF Recommendation to screen - Ages 17-79 yo.  Never done   Colon Cancer Screening  Never done   COVID-19 Vaccine (5 - 2023-24 season) 05/27/2022   Flu Shot  02/06/2023   Mammogram  04/16/2023   DTaP/Tdap/Td vaccine (4 - Td or Tdap) 10/12/2024   Pneumonia Vaccine  Completed   DEXA scan (bone density measurement)  Completed   Zoster (Shingles) Vaccine  Completed   HPV Vaccine  Aged Out    Advanced directives: No  Conditions/risks identified: Yes  Next appointment: Follow up in one year for your annual wellness visit.   Preventive Care 90 Years and Older, Female Preventive care refers to lifestyle choices and visits with your health care provider that can promote health and wellness. What does preventive care include? A yearly physical exam. This is also called an annual well check. Dental exams once or twice a year. Routine eye exams. Ask your health care provider how often you should have your eyes checked. Personal lifestyle choices, including: Daily care of your teeth and gums. Regular physical activity. Eating a healthy diet. Avoiding tobacco and drug use. Limiting alcohol use. Practicing safe sex. Taking low-dose aspirin every day. Taking vitamin and mineral supplements as recommended by your health care provider. What happens during an annual well check? The services and screenings done by your health care provider during your annual well check will depend on your age, overall health, lifestyle risk  factors, and family history of disease. Counseling  Your health care provider may ask you questions about your: Alcohol use. Tobacco use. Drug use. Emotional well-being. Home and relationship well-being. Sexual activity. Eating habits. History of falls. Memory and ability to understand (cognition). Work and work Astronomer. Reproductive health. Screening  You may have the following tests or measurements: Height, weight, and BMI. Blood pressure. Lipid and cholesterol levels. These may be checked every 5 years, or more frequently if you are over 61 years old. Skin check. Lung cancer screening. You may have this screening every year starting at age 51 if you have a 30-pack-year history of smoking and currently smoke or have quit within the past 15 years. Fecal occult blood test (FOBT) of the stool. You may have this test every year starting at age 26. Flexible sigmoidoscopy or colonoscopy. You may have a sigmoidoscopy every 5 years or a colonoscopy every 10 years starting at age 19. Hepatitis C blood test. Hepatitis B blood test. Sexually transmitted disease (STD) testing. Diabetes screening. This is done by checking your blood sugar (glucose) after you have not eaten for a while (fasting). You may have this done every 1-3 years. Bone density scan. This is done to screen for osteoporosis. You may have this done starting at age 15. Mammogram. This may be done every 1-2 years. Talk to your health care provider about how often you should have regular mammograms. Talk with your health care provider about your test results,  treatment options, and if necessary, the need for more tests. Vaccines  Your health care provider may recommend certain vaccines, such as: Influenza vaccine. This is recommended every year. Tetanus, diphtheria, and acellular pertussis (Tdap, Td) vaccine. You may need a Td booster every 10 years. Zoster vaccine. You may need this after age 62. Pneumococcal 13-valent  conjugate (PCV13) vaccine. One dose is recommended after age 22. Pneumococcal polysaccharide (PPSV23) vaccine. One dose is recommended after age 47. Talk to your health care provider about which screenings and vaccines you need and how often you need them. This information is not intended to replace advice given to you by your health care provider. Make sure you discuss any questions you have with your health care provider. Document Released: 07/21/2015 Document Revised: 03/13/2016 Document Reviewed: 04/25/2015 Elsevier Interactive Patient Education  2017 [employer] Prevention in the Home Falls can cause injuries. They can happen to people of all ages. There are many things you can do to make your home safe and to help prevent falls. What can I do on the outside of my home? Regularly fix the edges of walkways and driveways and fix any cracks. Remove anything that might make you trip as you walk through a door, such as a raised step or threshold. Trim any bushes or trees on the path to your home. Use bright outdoor lighting. Clear any walking paths of anything that might make someone trip, such as rocks or tools. Regularly check to see if handrails are loose or broken. Make sure that both sides of any steps have handrails. Any raised decks and porches should have guardrails on the edges. Have any leaves, snow, or ice cleared regularly. Use sand or salt on walking paths during winter. Clean up any spills in your garage right away. This includes oil or grease spills. What can I do in the bathroom? Use night lights. Install grab bars by the toilet and in the tub and shower. Do not use towel bars as grab bars. Use non-skid mats or decals in the tub or shower. If you need to sit down in the shower, use a plastic, non-slip stool. Keep the floor dry. Clean up any water that spills on the floor as soon as it happens. Remove soap buildup in the tub or shower regularly. Attach bath mats  securely with double-sided non-slip rug tape. Do not have throw rugs and other things on the floor that can make you trip. What can I do in the bedroom? Use night lights. Make sure that you have a light by your bed that is easy to reach. Do not use any sheets or blankets that are too big for your bed. They should not hang down onto the floor. Have a firm chair that has side arms. You can use this for support while you get dressed. Do not have throw rugs and other things on the floor that can make you trip. What can I do in the kitchen? Clean up any spills right away. Avoid walking on wet floors. Keep items that you use a lot in easy-to-reach places. If you need to reach something above you, use a strong step stool that has a grab bar. Keep electrical cords out of the way. Do not use floor polish or wax that makes floors slippery. If you must use wax, use non-skid floor wax. Do not have throw rugs and other things on the floor that can make you trip. What can I do with my stairs? Do  not leave any items on the stairs. Make sure that there are handrails on both sides of the stairs and use them. Fix handrails that are broken or loose. Make sure that handrails are as long as the stairways. Check any carpeting to make sure that it is firmly attached to the stairs. Fix any carpet that is loose or worn. Avoid having throw rugs at the top or bottom of the stairs. If you do have throw rugs, attach them to the floor with carpet tape. Make sure that you have a light switch at the top of the stairs and the bottom of the stairs. If you do not have them, ask someone to add them for you. What else can I do to help prevent falls? Wear shoes that: Do not have high heels. Have rubber bottoms. Are comfortable and fit you well. Are closed at the toe. Do not wear sandals. If you use a stepladder: Make sure that it is fully opened. Do not climb a closed stepladder. Make sure that both sides of the stepladder  are locked into place. Ask someone to hold it for you, if possible. Clearly mark and make sure that you can see: Any grab bars or handrails. First and last steps. Where the edge of each step is. Use tools that help you move around (mobility aids) if they are needed. These include: Canes. Walkers. Scooters. Crutches. Turn on the lights when you go into a dark area. Replace any light bulbs as soon as they burn out. Set up your furniture so you have a clear path. Avoid moving your furniture around. If any of your floors are uneven, fix them. If there are any pets around you, be aware of where they are. Review your medicines with your doctor. Some medicines can make you feel dizzy. This can increase your chance of falling. Ask your doctor what other things that you can do to help prevent falls. This information is not intended to replace advice given to you by your health care provider. Make sure you discuss any questions you have with your health care provider. Document Released: 04/20/2009 Document Revised: 11/30/2015 Document Reviewed: 07/29/2014 Elsevier Interactive Patient Education  2017 [employer].

## 2022-11-14 NOTE — Progress Notes (Signed)
I connected with  Iva Lento on 11/14/22 by a audio enabled telemedicine application and verified that I am speaking with the correct person using two identifiers.  Patient Location: Home  Provider Location: Office/Clinic  I discussed the limitations of evaluation and management by telemedicine. The patient expressed understanding and agreed to proceed.  Subjective:   Jennifer Hopkins is a 72 y.o. female who presents for Medicare Annual (Subsequent) preventive examination.  Review of Systems     Cardiac Risk Factors include: advanced age (>47men, >22 women);dyslipidemia     Objective:    Today's Vitals   11/14/22 1052  Weight: 125 lb (56.7 kg)  Height: 5\' 2"  (1.575 m)  PainSc: 0-No pain   Body mass index is 22.86 kg/m.     11/14/2022   10:56 AM 11/02/2021   10:11 AM 03/14/2016    8:44 AM 03/16/2015    9:12 AM 08/17/2013    9:00 PM  Advanced Directives  Does Patient Have a Medical Advance Directive? No No No No Patient does not have advance directive  Would patient like information on creating a medical advance directive? No - Patient declined No - Patient declined  No - patient declined information   Pre-existing out of facility DNR order (yellow form or pink MOST form)     No    Current Medications (verified) Outpatient Encounter Medications as of 11/14/2022  Medication Sig   alendronate (FOSAMAX) 70 MG tablet TAKE 1 TABLET BY MOUTH EVERY 7 DAYS WITH A FULL GLASS OF WATER ON AN EMPTY STOMACH   calcium-vitamin D (OSCAL WITH D) 250-125 MG-UNIT per tablet Take 1 tablet by mouth daily.   cholecalciferol (VITAMIN D) 1000 UNITS tablet Take 1,000 Units by mouth daily. Pt.  Not sure of dosage   co-enzyme Q-10 30 MG capsule Take 30 mg by mouth 3 (three) times daily.   diphenhydrAMINE (SOMINEX) 25 MG tablet Take 25 mg by mouth at bedtime as needed.   fexofenadine (ALLEGRA) 30 MG tablet Take 30 mg by mouth daily.   Flaxseed, Linseed, (FLAX SEED OIL PO) Take by mouth.   ibuprofen  (ADVIL,MOTRIN) 200 MG tablet Take 200 mg by mouth every 6 (six) hours as needed (pain).   Multiple Vitamin (MULTIVITAMIN) tablet Take 1 tablet by mouth daily.   pravastatin (PRAVACHOL) 40 MG tablet TAKE 1 TABLET BY MOUTH EVERY DAY   pseudoephedrine (SUDAFED) 30 MG tablet Take 30 mg by mouth every 4 (four) hours as needed. Reported on 08/02/2015   vitamin C (VITAMIN C) 1000 MG tablet Take 1 tablet (1,000 mg total) by mouth daily.   No facility-administered encounter medications on file as of 11/14/2022.    Allergies (verified) Patient has no known allergies.   History: Past Medical History:  Diagnosis Date   Allergy    Cancer (HCC)    Breast   Rheumatic fever    Past Surgical History:  Procedure Laterality Date   BASAL CELL CARCINOMA EXCISION  12/2009   BREAST LUMPECTOMY Right 02/2001   MOHS SURGERY  2001   at [facility]   OPEN REDUCTION INTERNAL FIXATION (ORIF) DISTAL RADIAL FRACTURE Left 08/17/2013   Procedure: OPEN REDUCTION INTERNAL FIXATION (ORIF) DISTAL RADIAL FRACTURE;  Surgeon: Dominica Severin, MD;  Location: [facility] OR;  Service: Orthopedics;  Laterality: Left;   Family History  Problem Relation Age of Onset   Cancer Mother        Breast Cancer   Hyperlipidemia Father    Heart disease Father    Hypertension  Father    Heart disease Paternal Aunt    Heart disease Paternal Uncle    Heart disease Paternal Grandmother    Heart disease Paternal Grandfather    Social History   Socioeconomic History   Marital status: Divorced    Spouse name: Not on file   Number of children: Not on file   Years of education: Not on file   Highest education level: Not on file  Occupational History   Not on file  Tobacco Use   Smoking status: Never   Smokeless tobacco: Never  Substance and Sexual Activity   Alcohol use: Yes    Comment: occas   Drug use: No   Sexual activity: Yes  Other Topics Concern   Not on file  Social History Narrative   DIVORCED   2 CHILDREN         Social  Determinants of Health   Financial Resource Strain: Low Risk  (11/14/2022)   Overall Financial Resource Strain (CARDIA)    Difficulty of Paying Living Expenses: Not hard at all  Food Insecurity: No Food Insecurity (11/14/2022)   Hunger Vital Sign    Worried About Running Out of Food in the Last Year: Never true    Ran Out of Food in the Last Year: Never true  Transportation Needs: No Transportation Needs (11/14/2022)   PRAPARE - Administrator, Civil Service (Medical): No    Lack of Transportation (Non-Medical): No  Physical Activity: Sufficiently Active (11/14/2022)   Exercise Vital Sign    Days of Exercise per Week: 5 days    Minutes of Exercise per Session: 30 min  Stress: No Stress Concern Present (11/14/2022)   [employer] of Occupational Health - Occupational Stress Questionnaire    Feeling of Stress : Only a little  Social Connections: Moderately Isolated (11/14/2022)   Social Connection and Isolation Panel [NHANES]    Frequency of Communication with Friends and Family: Three times a week    Frequency of Social Gatherings with Friends and Family: Three times a week    Attends Religious Services: 1 to 4 times per year    Active Member of Clubs or Organizations: No    Attends Banker Meetings: Never    Marital Status: Divorced    Tobacco Counseling Counseling given: Not Answered   Clinical Intake:  Pre-visit preparation completed: Yes  Pain : No/denies pain Pain Score: 0-No pain     BMI - recorded: 22.86 Nutritional Status: BMI of 19-24  Normal Nutritional Risks: None Diabetes: No  How often do you need to have someone help you when you read instructions, pamphlets, or other written materials from your doctor or pharmacy?: 1 - Never What is the last grade level you completed in school?: HSG  Diabetic? No  Interpreter Needed?: No  Information entered by :: Susie Cassette, LPN.   Activities of Daily Living    11/14/2022   10:58 AM  In  your present state of health, do you have any difficulty performing the following activities:  Hearing? 0  Vision? 0  Difficulty concentrating or making decisions? 1  Walking or climbing stairs? 0  Dressing or bathing? 0  Doing errands, shopping? 0  Preparing Food and eating ? N  Using the Toilet? N  In the past six months, have you accidently leaked urine? N  Do you have problems with loss of bowel control? N  Managing your Medications? N  Managing your Finances? N  Housekeeping or managing your  Housekeeping? N    Patient Care Team: Myrlene Broker, MD as PCP - General (Internal Medicine)  Indicate any recent Medical Services you may have received from other than [facility] providers in the past year (date may be approximate).     Assessment:   This is a routine wellness examination for [facility].  Hearing/Vision screen Hearing Screening - Comments:: Denies hearing difficulties   Vision Screening - Comments:: Wear readers/sunglasses - up to date with routine eye exams with Jimmye Norman, OD.   Dietary issues and exercise activities discussed: Current Exercise Habits: Home exercise routine, Type of exercise: walking, Time (Minutes): 30, Frequency (Times/Week): 5, Weekly Exercise (Minutes/Week): 150, Intensity: Moderate, Exercise limited by: None identified   Goals Addressed             This Visit's Progress    My goal for 2024 is to maintain my health.        Depression Screen    11/14/2022   10:58 AM 11/05/2021    1:23 PM 11/02/2021   10:13 AM 11/02/2021   10:09 AM 10/16/2020   12:54 PM 10/15/2019    2:12 PM 08/31/2018    8:34 AM  PHQ 2/9 Scores  PHQ - 2 Score 0 1 0 0 0 0 0  PHQ- 9 Score 0 2         Fall Risk    11/14/2022   10:55 AM 11/05/2021    1:23 PM 11/02/2021   10:12 AM 10/16/2020   12:54 PM 10/15/2019    2:12 PM  Fall Risk   Falls in the past year? 1 0 0 0 0  Number falls in past yr: 0  0 0   Injury with Fall? 0  0 0   Follow up   Falls evaluation completed   Falls evaluation completed    FALL RISK PREVENTION PERTAINING TO THE HOME:  Any stairs in or around the home? Yes  If so, are there any without handrails? No  Home free of loose throw rugs in walkways, pet beds, electrical cords, etc? Yes  Adequate lighting in your home to reduce risk of falls? Yes   ASSISTIVE DEVICES UTILIZED TO PREVENT FALLS:  Life alert? No  Use of a cane, walker or w/c? No  Grab bars in the bathroom? No  Shower chair or bench in shower? No  Elevated toilet seat or a handicapped toilet? No   TIMED UP AND GO:  Was the test performed? No . Telephonic Visit  Cognitive Function:        11/14/2022   11:00 AM  6CIT Screen  What Year? 0 points  What month? 0 points  What time? 0 points  Count back from 20 0 points  Months in reverse 0 points  Repeat phrase 0 points  Total Score 0 points    Immunizations Immunization History  Administered Date(s) Administered   Fluad Quad(high Dose 65+) 04/01/2022   Hepatitis A 04/16/2004, 03/30/2007   Hepatitis B 10/04/2013, 11/04/2013, 01/24/2014   IPV 03/30/2007   Influenza, High Dose Seasonal PF 05/15/2016, 03/30/2020, 03/25/2021   Influenza-Unspecified 04/21/2015, 03/31/2017, 03/16/2018   Meningococcal polysaccharide vaccine (MPSV4) 03/30/2007   Moderna Sars-Covid-2 Vaccination 08/10/2019, 09/07/2019   PFIZER Comirnaty(Gray Top)Covid-19 Tri-Sucrose Vaccine 04/01/2022   Pfizer Covid-19 Vaccine Bivalent Booster 32yrs & up 03/25/2021   Pneumococcal Conjugate-13 08/02/2015   Pneumococcal Polysaccharide-23 08/01/2016   Td 04/16/2004, 10/13/2014   Tdap 10/13/2014   Typhoid Inactivated 03/30/2007, 08/10/2012, 10/13/2014   Yellow Fever 07/06/2015   Zoster  Recombinat (Shingrix) 11/17/2017, 02/03/2018   Zoster, Live 11/04/2013    TDAP status: Up to date  Flu Vaccine status: Up to date  Pneumococcal vaccine status: Up to date  Covid-19 vaccine status: Completed vaccines  Qualifies for Shingles Vaccine? Yes    Zostavax completed Yes   Shingrix Completed?: Yes  Screening Tests Health Maintenance  Topic Date Due   Hepatitis C Screening  Never done   COLONOSCOPY (Pts 45-63yrs Insurance coverage will need to be confirmed)  Never done   COVID-19 Vaccine (5 - 2023-24 season) 05/27/2022   INFLUENZA VACCINE  02/06/2023   MAMMOGRAM  04/16/2023   DTaP/Tdap/Td (4 - Td or Tdap) 10/12/2024   Pneumonia Vaccine 78+ Years old  Completed   DEXA SCAN  Completed   Zoster Vaccines- Shingrix  Completed   HPV VACCINES  Aged Out    Health Maintenance  Health Maintenance Due  Topic Date Due   Hepatitis C Screening  Never done   COLONOSCOPY (Pts 45-2yrs Insurance coverage will need to be confirmed)  Never done   COVID-19 Vaccine (5 - 2023-24 season) 05/27/2022    Colorectal cancer screening: Patient declined  Mammogram status: Completed 04/15/2022. Repeat every year  Bone Density status: Completed 10/25/2020. Results reflect: Bone density results: OSTEOPOROSIS. Repeat every 2 years.  Lung Cancer Screening: (Low Dose CT Chest recommended if Age 31-80 years, 30 pack-year currently smoking OR have quit w/in 15years.) does not qualify.   Lung Cancer Screening Referral: No  Additional Screening:  Hepatitis C Screening: does qualify; Completed: No  Vision Screening: Recommended annual ophthalmology exams for early detection of glaucoma and other disorders of the eye. Is the patient up to date with their annual eye exam?  Yes  Who is the provider or what is the name of the office in which the patient attends annual eye exams? Jimmye Norman, OD. If pt is not established with a provider, would they like to be referred to a provider to establish care? No .   Dental Screening: Recommended annual dental exams for proper oral hygiene  Community Resource Referral / Chronic Care Management: CRR required this visit?  No   CCM required this visit?  No      Plan:     I have personally reviewed and noted  the following in the patient's chart:   Medical and social history Use of alcohol, tobacco or illicit drugs  Current medications and supplements including opioid prescriptions. Patient is not currently taking opioid prescriptions. Functional ability and status Nutritional status Physical activity Advanced directives List of other physicians Hospitalizations, surgeries, and ER visits in previous 12 months Vitals Screenings to include cognitive, depression, and falls Referrals and appointments  In addition, I have reviewed and discussed with patient certain preventive protocols, quality metrics, and best practice recommendations. A written personalized care plan for preventive services as well as general preventive health recommendations were provided to patient.     Mickeal Needy, LPN   07/13/1094   Nurse Notes:  Normal cognitive status assessed by direct observation via telephone conversation by this Nurse Health Advisor. No abnormalities found.

## 2022-12-04 ENCOUNTER — Other Ambulatory Visit: Payer: Self-pay | Admitting: Internal Medicine

## 2022-12-25 DIAGNOSIS — K648 Other hemorrhoids: Secondary | ICD-10-CM | POA: Diagnosis not present

## 2022-12-25 DIAGNOSIS — Z1211 Encounter for screening for malignant neoplasm of colon: Secondary | ICD-10-CM | POA: Diagnosis not present

## 2022-12-25 DIAGNOSIS — Z86010 Personal history of colonic polyps: Secondary | ICD-10-CM | POA: Diagnosis not present

## 2022-12-25 LAB — HM COLONOSCOPY

## 2023-01-02 ENCOUNTER — Ambulatory Visit (INDEPENDENT_AMBULATORY_CARE_PROVIDER_SITE_OTHER): Payer: Medicare Other | Admitting: Internal Medicine

## 2023-01-02 ENCOUNTER — Other Ambulatory Visit: Payer: Self-pay | Admitting: Internal Medicine

## 2023-01-02 ENCOUNTER — Encounter: Payer: Self-pay | Admitting: Internal Medicine

## 2023-01-02 VITALS — BP 140/100 | HR 93 | Temp 98.1°F | Ht 62.0 in | Wt 127.0 lb

## 2023-01-02 DIAGNOSIS — R030 Elevated blood-pressure reading, without diagnosis of hypertension: Secondary | ICD-10-CM

## 2023-01-02 DIAGNOSIS — M810 Age-related osteoporosis without current pathological fracture: Secondary | ICD-10-CM

## 2023-01-02 DIAGNOSIS — Z0000 Encounter for general adult medical examination without abnormal findings: Secondary | ICD-10-CM

## 2023-01-02 DIAGNOSIS — Z0001 Encounter for general adult medical examination with abnormal findings: Secondary | ICD-10-CM | POA: Diagnosis not present

## 2023-01-02 DIAGNOSIS — E785 Hyperlipidemia, unspecified: Secondary | ICD-10-CM

## 2023-01-02 DIAGNOSIS — R413 Other amnesia: Secondary | ICD-10-CM | POA: Diagnosis not present

## 2023-01-02 DIAGNOSIS — G319 Degenerative disease of nervous system, unspecified: Secondary | ICD-10-CM | POA: Insufficient documentation

## 2023-01-02 LAB — LIPID PANEL
Cholesterol: 201 mg/dL — ABNORMAL HIGH (ref 0–200)
HDL: 44.5 mg/dL (ref >39.00)
NonHDL: 156.19
Total CHOL/HDL Ratio: 5
Triglycerides: 257 mg/dL — ABNORMAL HIGH (ref 0.0–149.0)
VLDL: 51.4 mg/dL — ABNORMAL HIGH (ref 0.0–40.0)

## 2023-01-02 LAB — COMPREHENSIVE METABOLIC PANEL
ALT: 12 U/L (ref 0–35)
AST: 25 U/L (ref 0–37)
Albumin: 4.2 g/dL (ref 3.5–5.2)
Alkaline Phosphatase: 71 U/L (ref 39–117)
BUN: 6 mg/dL (ref 6–23)
CO2: 31 mEq/L (ref 19–32)
Calcium: 9.7 mg/dL (ref 8.4–10.5)
Chloride: 100 mEq/L (ref 96–112)
Creatinine, Ser: 0.77 mg/dL (ref 0.40–1.20)
GFR: 76.02 mL/min (ref >60.00)
Glucose, Bld: 93 mg/dL (ref 70–99)
Potassium: 3.8 mEq/L (ref 3.5–5.1)
Sodium: 139 mEq/L (ref 135–145)
Total Bilirubin: 0.8 mg/dL (ref 0.2–1.2)
Total Protein: 7.6 g/dL (ref 6.0–8.3)

## 2023-01-02 LAB — CBC
HCT: 39.4 % (ref 36.0–46.0)
Hemoglobin: 13.1 g/dL (ref 12.0–15.0)
MCHC: 33.1 g/dL (ref 30.0–36.0)
MCV: 88.1 fl (ref 78.0–100.0)
Platelets: 398 10*3/uL (ref 150.0–400.0)
RBC: 4.47 Mil/uL (ref 3.87–5.11)
RDW: 13.1 % (ref 11.5–15.5)
WBC: 8.1 10*3/uL (ref 4.0–10.5)

## 2023-01-02 LAB — TSH: TSH: 2.1 u[IU]/mL (ref 0.35–5.50)

## 2023-01-02 LAB — LDL CHOLESTEROL, DIRECT: Direct LDL: 109 mg/dL

## 2023-01-02 LAB — VITAMIN D 25 HYDROXY (VIT D DEFICIENCY, FRACTURES): VITD: 38.1 ng/mL (ref 30.00–100.00)

## 2023-01-02 LAB — VITAMIN B12: Vitamin B-12: 420 pg/mL (ref 211–911)

## 2023-01-02 MED ORDER — PRAVASTATIN SODIUM 40 MG PO TABS: 40.0000 mg | ORAL_TABLET | Freq: Every day | ORAL | 1 refills | Status: DC

## 2023-01-02 NOTE — Patient Instructions (Signed)
We will check the labs and the MRI of the brain to check the memory.  ? ? ?

## 2023-01-02 NOTE — Assessment & Plan Note (Signed)
Checking TSH, B12, vitamin D, CBC, CMP and MRI brain. 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 She is having some losing of thoughts in middle of sentence, walking into rooms and forgetting what she was in there for. She is using lists to help and encouraged this.

## 2023-01-02 NOTE — Assessment & Plan Note (Signed)
Checking lipid panel and adjust as needed her pravastatin 40 mg daily.

## 2023-01-02 NOTE — Progress Notes (Signed)
   Subjective:   Patient ID: Jennifer Hopkins, female    DOB: [birth year], 72 y.o.   MRN: [id number]  HPI The patient is here for physical. Some new memory struggles with losing words in sentence  PMH, [facility], social history reviewed and updated  Review of Systems  Constitutional: Negative.   HENT: Negative.    Eyes: Negative.   Respiratory:  Negative for cough, chest tightness and shortness of breath.   Cardiovascular:  Negative for chest pain, palpitations and leg swelling.  Gastrointestinal:  Negative for abdominal distention, abdominal pain, constipation, diarrhea, nausea and vomiting.  Musculoskeletal: Negative.   Skin: Negative.   Neurological: Negative.   Psychiatric/Behavioral: Negative.      Objective:  Physical Exam Constitutional:      Appearance: She is well-developed.  HENT:     Head: Normocephalic and atraumatic.  Cardiovascular:     Rate and Rhythm: Normal rate and regular rhythm.  Pulmonary:     Effort: Pulmonary effort is normal. No respiratory distress.     Breath sounds: Normal breath sounds. No wheezing or rales.  Abdominal:     General: Bowel sounds are normal. There is no distension.     Palpations: Abdomen is soft.     Tenderness: There is no abdominal tenderness. There is no rebound.  Musculoskeletal:     Cervical back: Normal range of motion.  Skin:    General: Skin is warm and dry.  Neurological:     Mental Status: She is alert and oriented to person, place, and time.     Coordination: Coordination normal.     Vitals:   01/02/23 1052 01/02/23 1055  BP: (!) 140/100 (!) 140/100  Pulse: 93   Temp: 98.1 F (36.7 C)   TempSrc: Oral   SpO2: 95%   Weight: 127 lb (57.6 kg)   Height: 5\' 2"  (1.575 m)     Assessment & Plan:

## 2023-01-02 NOTE — Assessment & Plan Note (Signed)
She has completed 5 years of fosamax so we will discontinue. She will do DEXA 2025 for monitoring and then every 2-3 years thereafter. If bone loss we can consider switch to prolia.

## 2023-01-02 NOTE — Assessment & Plan Note (Signed)
Flu shot yearly. Pneumonia complete. Shingrix complete. Tetanus due 2026. Colonoscopy aged out before recall. Mammogram due Oct 2024, pap smear aged out and dexa due 2025. Counseled about sun safety and mole surveillance. Counseled about the dangers of distracted driving. Given 10 year screening recommendations.

## 2023-01-02 NOTE — Assessment & Plan Note (Signed)
BP high today but on review normal at all other encounters over last year. She is asked to monitor intermittently at home and let us know.

## 2023-01-05 ENCOUNTER — Ambulatory Visit
Admission: RE | Admit: 2023-01-05 | Discharge: 2023-01-05 | Disposition: A | Discharge status: Home / Self Care | Payer: Medicare Other | Source: Ambulatory Visit | Attending: Internal Medicine | Admitting: Internal Medicine

## 2023-01-05 DIAGNOSIS — R413 Other amnesia: Secondary | ICD-10-CM | POA: Diagnosis not present

## 2023-01-16 ENCOUNTER — Encounter: Payer: Self-pay | Admitting: Plastic Surgery

## 2023-01-16 ENCOUNTER — Ambulatory Visit (INDEPENDENT_AMBULATORY_CARE_PROVIDER_SITE_OTHER): Payer: Self-pay | Admitting: Plastic Surgery

## 2023-01-16 VITALS — BP 134/85 | HR 92 | Ht 62.0 in | Wt 125.0 lb

## 2023-01-16 DIAGNOSIS — Q178 Other specified congenital malformations of ear: Secondary | ICD-10-CM

## 2023-01-16 NOTE — Progress Notes (Signed)
Referring Provider Myrlene Broker, MD [id number] [place],  [place] [address]   CC:  Chief Complaint  Patient presents with   Consult           Jennifer Hopkins is an 72 y.o. female.  HPI: Jennifer Hopkins is a 72 year old female who presents today with an acquired defect in the right earlobe from an earring piercing which is pulled through.  She would like to have the earlobe repaired.  No Known Allergies  Outpatient Encounter Medications as of 01/16/2023  Medication Sig   calcium-vitamin D (OSCAL WITH D) 250-125 MG-UNIT per tablet Take 1 tablet by mouth daily.   cholecalciferol (VITAMIN D) 1000 UNITS tablet Take 1,000 Units by mouth daily. Pt.  Not sure of dosage   co-enzyme Q-10 30 MG capsule Take 30 mg by mouth 3 (three) times daily.   diphenhydrAMINE (SOMINEX) 25 MG tablet Take 25 mg by mouth at bedtime as needed.   fexofenadine (ALLEGRA) 30 MG tablet Take 30 mg by mouth daily.   Flaxseed, Linseed, (FLAX SEED OIL PO) Take by mouth.   ibuprofen (ADVIL,MOTRIN) 200 MG tablet Take 200 mg by mouth every 6 (six) hours as needed (pain).   Multiple Vitamin (MULTIVITAMIN) tablet Take 1 tablet by mouth daily.   pravastatin (PRAVACHOL) 40 MG tablet Take 1 tablet (40 mg total) by mouth daily.   pseudoephedrine (SUDAFED) 30 MG tablet Take 30 mg by mouth every 4 (four) hours as needed. Reported on 08/02/2015   vitamin C (VITAMIN C) 1000 MG tablet Take 1 tablet (1,000 mg total) by mouth daily.   No facility-administered encounter medications on file as of 01/16/2023.     Past Medical History:  Diagnosis Date   Allergy    Cancer [facility])    Breast   Rheumatic fever     Past Surgical History:  Procedure Laterality Date   BASAL CELL CARCINOMA EXCISION  12/2009   BREAST LUMPECTOMY Right 02/2001   MOHS SURGERY  2001   at [facility]   OPEN REDUCTION INTERNAL FIXATION (ORIF) DISTAL RADIAL FRACTURE Left 08/17/2013   Procedure: OPEN REDUCTION INTERNAL FIXATION (ORIF) DISTAL RADIAL  FRACTURE;  Surgeon: Dominica Severin, MD;  Location: [facility] OR;  Service: Orthopedics;  Laterality: Left;    Family History  Problem Relation Age of Onset   Cancer Mother        Breast Cancer   Hyperlipidemia Father    Heart disease Father    Hypertension Father    Heart disease Paternal Aunt    Heart disease Paternal Uncle    Heart disease Paternal Grandmother    Heart disease Paternal Grandfather     Social History   Social History Narrative   DIVORCED   2 CHILDREN           Review of Systems General: Denies fevers, chills, weight loss CV: Denies chest pain, shortness of breath, palpitations Skin: Split right earlobe  Physical Exam    01/16/2023    9:26 AM 01/02/2023   10:55 AM 01/02/2023   10:52 AM  Vitals with BMI  Height 5\' 2"   5\' 2"   Weight 125 lbs  127 lbs  BMI 22.86  23.22  Systolic 134 140 086  Diastolic 85 100 100  Pulse 92  93    General:  No acute distress,  Alert and oriented, Non-Toxic, Normal speech and affect Integument: Right earlobe with a defect from an earring pulling through. Mammogram: October 2023 BI-RADS 1 Assessment/Plan Split earlobe: Patient has  an acquired defect in the right earlobe.  Discussed the procedure for repairing earlobes and the need to refrain from piercing for 6 months.  Patient will return for repair of her earlobe.  [name] 01/16/2023, 9:42 AM

## 2023-01-31 DIAGNOSIS — L821 Other seborrheic keratosis: Secondary | ICD-10-CM | POA: Diagnosis not present

## 2023-01-31 DIAGNOSIS — D225 Melanocytic nevi of trunk: Secondary | ICD-10-CM | POA: Diagnosis not present

## 2023-01-31 DIAGNOSIS — L820 Inflamed seborrheic keratosis: Secondary | ICD-10-CM | POA: Diagnosis not present

## 2023-01-31 DIAGNOSIS — D485 Neoplasm of uncertain behavior of skin: Secondary | ICD-10-CM | POA: Diagnosis not present

## 2023-01-31 DIAGNOSIS — Z08 Encounter for follow-up examination after completed treatment for malignant neoplasm: Secondary | ICD-10-CM | POA: Diagnosis not present

## 2023-01-31 DIAGNOSIS — L570 Actinic keratosis: Secondary | ICD-10-CM | POA: Diagnosis not present

## 2023-01-31 DIAGNOSIS — L538 Other specified erythematous conditions: Secondary | ICD-10-CM | POA: Diagnosis not present

## 2023-01-31 DIAGNOSIS — L814 Other melanin hyperpigmentation: Secondary | ICD-10-CM | POA: Diagnosis not present

## 2023-02-10 ENCOUNTER — Ambulatory Visit (INDEPENDENT_AMBULATORY_CARE_PROVIDER_SITE_OTHER): Payer: Medicare Other | Admitting: Internal Medicine

## 2023-02-10 ENCOUNTER — Encounter: Payer: Self-pay | Admitting: Internal Medicine

## 2023-02-10 VITALS — BP 130/86 | HR 86 | Temp 98.0°F | Ht 62.0 in | Wt 124.0 lb

## 2023-02-10 DIAGNOSIS — G319 Degenerative disease of nervous system, unspecified: Secondary | ICD-10-CM | POA: Diagnosis not present

## 2023-02-10 DIAGNOSIS — E785 Hyperlipidemia, unspecified: Secondary | ICD-10-CM

## 2023-02-10 MED ORDER — DONEPEZIL HCL 5 MG PO TABS
5.0000 mg | ORAL_TABLET | Freq: Every day | ORAL | 3 refills | Status: DC
Start: 2023-02-10 — End: 2023-06-03

## 2023-02-10 NOTE — Assessment & Plan Note (Signed)
With some atrophy noted on MRI scan and moderate chronic small vessel disease. She is on pravastatin 40 mg daily and cholesterol well managed. Will add aricept 5 mg daily to help avoid progression. Follow up 6 months.

## 2023-02-10 NOTE — Patient Instructions (Signed)
We have sent in the aricept to start taking 1 pill daily.

## 2023-02-10 NOTE — Progress Notes (Signed)
   Subjective:   Patient ID: Jennifer Hopkins, female    DOB: 03-14-1951, 72 y.o.   MRN: [id number]  HPI The patient is a 72 YO female coming in for follow up recent testing. Still with minor memory issues.   Review of Systems  Constitutional: Negative.   HENT: Negative.    Eyes: Negative.   Respiratory:  Negative for cough, chest tightness and shortness of breath.   Cardiovascular:  Negative for chest pain, palpitations and leg swelling.  Gastrointestinal:  Negative for abdominal distention, abdominal pain, constipation, diarrhea, nausea and vomiting.  Musculoskeletal: Negative.   Skin: Negative.   Neurological: Negative.   Psychiatric/Behavioral: Negative.      Objective:  Physical Exam Constitutional:      Appearance: She is well-developed.  HENT:     Head: Normocephalic and atraumatic.  Cardiovascular:     Rate and Rhythm: Normal rate and regular rhythm.  Pulmonary:     Effort: Pulmonary effort is normal. No respiratory distress.     Breath sounds: Normal breath sounds. No wheezing or rales.  Abdominal:     General: Bowel sounds are normal. There is no distension.     Palpations: Abdomen is soft.     Tenderness: There is no abdominal tenderness. There is no rebound.  Musculoskeletal:     Cervical back: Normal range of motion.  Skin:    General: Skin is warm and dry.  Neurological:     Mental Status: She is alert and oriented to person, place, and time.     Coordination: Coordination normal.     Vitals:   02/10/23 1026  BP: 130/86  Pulse: 86  Temp: 98 F (36.7 C)  TempSrc: Oral  SpO2: 98%  Weight: 124 lb (56.2 kg)  Height: 5\' 2"  (1.575 m)    Assessment & Plan:  Visit time 25 minutes in face to face communication with patient and coordination of care, additional 5 minutes spent in record review, coordination or care, ordering tests, communicating/referring to other healthcare professionals, documenting in medical records all on the same day of the visit for  total time 30 minutes spent on the visit.

## 2023-02-10 NOTE — Assessment & Plan Note (Signed)
Counseled about recent labs. Offered next lipid panel fasting no adjustment to medication currently.

## 2023-02-19 ENCOUNTER — Ambulatory Visit (INDEPENDENT_AMBULATORY_CARE_PROVIDER_SITE_OTHER): Payer: Self-pay | Admitting: Plastic Surgery

## 2023-02-19 VITALS — BP 134/91 | HR 101

## 2023-02-19 DIAGNOSIS — Q178 Other specified congenital malformations of ear: Secondary | ICD-10-CM

## 2023-02-19 NOTE — Progress Notes (Signed)
Procedure Note  Preoperative Dx: Acquired defect right earlobe  Postoperative Dx: Same  Procedure: Repair of right earlobe  Anesthesia: Lidocaine 1% with 1:100,000 epinephrine and 0.25% Sensorcaine   Indication for Procedure: Aesthetic repair  Description of Procedure: Risks and complications were explained to the patient including distortion of the earlobe.  Consent was confirmed and the patient understands the risks and benefits.  The potential complications and alternatives were explained and the patient consents.  The patient expressed understanding the option of not having the procedure and the risks of a scar.  Time out was called and all information was confirmed to be correct.    The area was prepped and drapped.  Local anesthetic was injected in the subcutaneous tissues.  After waiting for the local to take affect the epithelialized surfaces of the earlobe were excised.  After obtaining hemostasis, the surgical wound was closed with a 5-0 Monocryl suture in the subcutaneous tissues and interrupted 5-0 Prolene sutures and the skin.  The surgical wound measured 5 mm.  A dressing was applied.  The patient was given instructions on how to care for the area and a follow up appointment.  Celestina tolerated the procedure well and there were no complications.

## 2023-02-24 ENCOUNTER — Encounter: Payer: Self-pay | Admitting: Surgical

## 2023-02-26 ENCOUNTER — Ambulatory Visit (INDEPENDENT_AMBULATORY_CARE_PROVIDER_SITE_OTHER): Payer: Self-pay | Admitting: Surgical

## 2023-02-26 DIAGNOSIS — Q178 Other specified congenital malformations of ear: Secondary | ICD-10-CM

## 2023-02-26 NOTE — Progress Notes (Signed)
Patient is here for follow-up after repair of split earlobe 1 week ago.  She is doing well.  She is not having any issues.  Prolene suture knots were removed.  No signs of infection or concern on exam.  Incision is well-healed.  We will plan to see the patient back in about 6 months for piercing of the right ear.  She was recommended to call with any questions or concerns.

## 2023-03-13 ENCOUNTER — Telehealth: Payer: Self-pay | Admitting: Internal Medicine

## 2023-03-13 NOTE — Telephone Encounter (Signed)
 Pt will come in for an office visit.

## 2023-03-13 NOTE — Telephone Encounter (Signed)
Pt mentioned the med refill was for her upset stomach, loose stool/ diarrhea and she is traveling and need these meds.

## 2023-03-13 NOTE — Telephone Encounter (Signed)
If there is time before trip I recommend a travel advice visit with me so we can ensure she has all needed vaccinations and medications for travel. Can be in person or virtual. If not route back to me

## 2023-03-13 NOTE — Telephone Encounter (Signed)
Pt is here and she states that she will be out of country and needs a refill bad on Penicillin or Amoxicillin if possible.[name]

## 2023-03-13 NOTE — Telephone Encounter (Signed)
Amoxicillin is an antibiotic and only for active infection. Is she currently having symptoms or she is looking to get a medicine to take with her for if needed while traveling?

## 2023-03-13 NOTE — Telephone Encounter (Signed)
For what reason?   

## 2023-03-13 NOTE — Telephone Encounter (Signed)
Explained to patient that what she was asking for [place] an antibiotic ask if she was having an active infection such as UTI, ear infection or any kid of infection patient stated "no" sh is traveling out of the country and would like to have something to help with loose stool while traveling.

## 2023-04-02 ENCOUNTER — Ambulatory Visit: Payer: Medicare Other | Admitting: Internal Medicine

## 2023-04-02 ENCOUNTER — Encounter: Payer: Self-pay | Admitting: Internal Medicine

## 2023-04-02 VITALS — BP 140/82 | HR 98 | Temp 98.1°F | Ht 62.0 in | Wt 118.0 lb

## 2023-04-02 DIAGNOSIS — Z7184 Encounter for health counseling related to travel: Secondary | ICD-10-CM | POA: Diagnosis not present

## 2023-04-02 MED ORDER — NITROFURANTOIN MONOHYD MACRO 100 MG PO CAPS: 100.0000 mg | ORAL_CAPSULE | Freq: Two times a day (BID) | ORAL | 0 refills | Status: DC

## 2023-04-02 MED ORDER — VIVOTIF PO CPDR: 1.0000 | DELAYED_RELEASE_CAPSULE | ORAL | 0 refills | Status: DC

## 2023-04-02 NOTE — Assessment & Plan Note (Signed)
Advised that she is due for updated typhoid so rx vivotif done. She does not have time to complete in ideal state but will be able to finish before leaving on trip. Advised current guidelines do not recommend preventative antibiotics to take with her for diarrhea given resistance patterns. Rx macrobid to take with in case of UTI. Counseled about foods more common to cause diarrhea while traveling and how to stay safe. Advised to take imodium and electrolyte packets to use with bottled water in case of diarrhea.

## 2023-04-02 NOTE — Patient Instructions (Addendum)
We have sent in macrobid to take in case of urine infection.   We recommend to take typhoid vaccine which is a pill to take every other day for 4 doses. Ideally finished 1-2 weeks before a trip.  We recommend to take imodium over the counter to start with any diarrhea symptoms and make sure to stay hydrated if you get a diarrhea illness.

## 2023-04-02 NOTE — Progress Notes (Signed)
Subjective:   Patient ID: Jennifer Hopkins, female    DOB: [birth year], 72 y.o.   MRN: [id number]  HPI The patient is a 72 YO female coming in for travel advice going to [place] leaving in 1-2 weeks. Was having some mild lightheadedness with change in diet to avoid sugars and carbs.   Review of Systems  Constitutional: Negative.   HENT: Negative.    Eyes: Negative.   Respiratory:  Negative for cough, chest tightness and shortness of breath.   Cardiovascular:  Negative for chest pain, palpitations and leg swelling.  Gastrointestinal:  Negative for abdominal distention, abdominal pain, constipation, diarrhea, nausea and vomiting.  Musculoskeletal: Negative.   Skin: Negative.   Neurological: Negative.   Psychiatric/Behavioral: Negative.      Objective:  Physical Exam Constitutional:      Appearance: She is well-developed.  HENT:     Head: Normocephalic and atraumatic.  Cardiovascular:     Rate and Rhythm: Normal rate and regular rhythm.  Pulmonary:     Effort: Pulmonary effort is normal. No respiratory distress.     Breath sounds: Normal breath sounds. No wheezing or rales.  Abdominal:     General: Bowel sounds are normal. There is no distension.     Palpations: Abdomen is soft.     Tenderness: There is no abdominal tenderness. There is no rebound.  Musculoskeletal:     Cervical back: Normal range of motion.  Skin:    General: Skin is warm and dry.  Neurological:     Mental Status: She is alert and oriented to person, place, and time.     Coordination: Coordination normal.     Vitals:   04/02/23 0935 04/02/23 0943  BP: (!) 140/82 (!) 140/82  Pulse: 98   Temp: 98.1 F (36.7 C)   TempSrc: Oral   SpO2: 97%   Weight: 118 lb (53.5 kg)   Height: 5\' 2"  (1.575 m)     Assessment & Plan:  Visit time 25 minutes in face to face communication with patient and coordination of care, additional 5 minutes spent in record review, coordination or care, ordering tests,  communicating/referring to other healthcare professionals, documenting in medical records all on the same day of the visit for total time 30 minutes spent on the visit.

## 2023-04-06 ENCOUNTER — Emergency Department (HOSPITAL_BASED_OUTPATIENT_CLINIC_OR_DEPARTMENT_OTHER): Payer: Medicare Other

## 2023-04-06 ENCOUNTER — Observation Stay (HOSPITAL_BASED_OUTPATIENT_CLINIC_OR_DEPARTMENT_OTHER)
Admission: EM | Admit: 2023-04-06 | Discharge: 2023-04-07 | Disposition: A | Discharge status: Home / Self Care | Payer: Medicare Other | Attending: Internal Medicine | Admitting: Internal Medicine

## 2023-04-06 DIAGNOSIS — Z853 Personal history of malignant neoplasm of breast: Secondary | ICD-10-CM | POA: Insufficient documentation

## 2023-04-06 DIAGNOSIS — I951 Orthostatic hypotension: Secondary | ICD-10-CM | POA: Diagnosis not present

## 2023-04-06 DIAGNOSIS — E785 Hyperlipidemia, unspecified: Secondary | ICD-10-CM | POA: Diagnosis present

## 2023-04-06 DIAGNOSIS — E871 Hypo-osmolality and hyponatremia: Principal | ICD-10-CM | POA: Diagnosis present

## 2023-04-06 DIAGNOSIS — R531 Weakness: Secondary | ICD-10-CM

## 2023-04-06 DIAGNOSIS — E876 Hypokalemia: Secondary | ICD-10-CM | POA: Diagnosis not present

## 2023-04-06 DIAGNOSIS — E7841 Elevated Lipoprotein(a): Secondary | ICD-10-CM | POA: Insufficient documentation

## 2023-04-06 DIAGNOSIS — R42 Dizziness and giddiness: Secondary | ICD-10-CM | POA: Diagnosis not present

## 2023-04-06 DIAGNOSIS — R17 Unspecified jaundice: Secondary | ICD-10-CM

## 2023-04-06 LAB — BASIC METABOLIC PANEL
Anion gap: 10 (ref 5–15)
Anion gap: 9 (ref 5–15)
BUN: 6 mg/dL — ABNORMAL LOW (ref 8–23)
BUN: 5 mg/dL — ABNORMAL LOW (ref 8–23)
CO2: 27 mmol/L (ref 22–32)
CO2: 28 mmol/L (ref 22–32)
Calcium: 9.9 mg/dL (ref 8.9–10.3)
Calcium: 9.5 mg/dL (ref 8.9–10.3)
Chloride: 86 mmol/L — ABNORMAL LOW (ref 98–111)
Chloride: 93 mmol/L — ABNORMAL LOW (ref 98–111)
Creatinine, Ser: 0.55 mg/dL (ref 0.44–1.00)
Creatinine, Ser: 0.5 mg/dL (ref 0.44–1.00)
GFR, Estimated: >60 mL/min (ref >60)
GFR, Estimated: >60 mL/min (ref >60)
Glucose, Bld: 114 mg/dL — ABNORMAL HIGH (ref 70–99)
Glucose, Bld: 116 mg/dL — ABNORMAL HIGH (ref 70–99)
Potassium: 2.6 mmol/L — CL (ref 3.5–5.1)
Potassium: 3 mmol/L — ABNORMAL LOW (ref 3.5–5.1)
Sodium: 123 mmol/L — ABNORMAL LOW (ref 135–145)
Sodium: 130 mmol/L — ABNORMAL LOW (ref 135–145)

## 2023-04-06 LAB — URINALYSIS, ROUTINE W REFLEX MICROSCOPIC
Bilirubin Urine: NEGATIVE
Glucose, UA: NEGATIVE mg/dL
Hgb urine dipstick: NEGATIVE
Ketones, ur: 15 mg/dL — AB
Leukocytes,Ua: NEGATIVE
Nitrite: NEGATIVE
Protein, ur: NEGATIVE mg/dL
Specific Gravity, Urine: <1.005 — ABNORMAL LOW (ref 1.005–1.030)
pH: 6 (ref 5.0–8.0)

## 2023-04-06 LAB — CBC
HCT: 38.4 % (ref 36.0–46.0)
Hemoglobin: 13.9 g/dL (ref 12.0–15.0)
MCH: 29.9 pg (ref 26.0–34.0)
MCHC: 36.2 g/dL — ABNORMAL HIGH (ref 30.0–36.0)
MCV: 82.6 fL (ref 80.0–100.0)
Platelets: 378 10*3/uL (ref 150–400)
RBC: 4.65 MIL/uL (ref 3.87–5.11)
RDW: 12.1 % (ref 11.5–15.5)
WBC: 10.8 10*3/uL — ABNORMAL HIGH (ref 4.0–10.5)
nRBC: 0 % (ref 0.0–0.2)

## 2023-04-06 LAB — HEPATIC FUNCTION PANEL
ALT: 12 U/L (ref 0–44)
AST: 21 U/L (ref 15–41)
Albumin: 4.5 g/dL (ref 3.5–5.0)
Alkaline Phosphatase: 58 U/L (ref 38–126)
Bilirubin, Direct: 0.3 mg/dL — ABNORMAL HIGH (ref 0.0–0.2)
Indirect Bilirubin: 1.2 mg/dL — ABNORMAL HIGH (ref 0.3–0.9)
Total Bilirubin: 1.5 mg/dL — ABNORMAL HIGH (ref 0.3–1.2)
Total Protein: 7.7 g/dL (ref 6.5–8.1)

## 2023-04-06 LAB — MAGNESIUM: Magnesium: 1.6 mg/dL — ABNORMAL LOW (ref 1.7–2.4)

## 2023-04-06 LAB — LIPASE, BLOOD: Lipase: <10 U/L — ABNORMAL LOW (ref 11–51)

## 2023-04-06 MED ORDER — PRAVASTATIN SODIUM 40 MG PO TABS
40.0000 mg | ORAL_TABLET | Freq: Every day | ORAL | Status: DC
  Administered 2023-04-07: 40 mg via ORAL
  Filled 2023-04-06: qty 1

## 2023-04-06 MED ORDER — SODIUM CHLORIDE 0.9 % IV SOLN: INTRAVENOUS | Status: AC

## 2023-04-06 MED ORDER — POTASSIUM CHLORIDE 10 MEQ/100ML IV SOLN
10.0000 meq | Freq: Once | INTRAVENOUS | Status: AC
  Administered 2023-04-06: 10 meq via INTRAVENOUS
  Filled 2023-04-06: qty 100

## 2023-04-06 MED ORDER — ACETAMINOPHEN 325 MG PO TABS: 650.0000 mg | ORAL_TABLET | Freq: Four times a day (QID) | ORAL | Status: DC | PRN

## 2023-04-06 MED ORDER — POTASSIUM CHLORIDE 10 MEQ/100ML IV SOLN
10.0000 meq | INTRAVENOUS | Status: AC
  Administered 2023-04-06 (×3): 10 meq via INTRAVENOUS
  Filled 2023-04-06: qty 100

## 2023-04-06 MED ORDER — POTASSIUM CHLORIDE 20 MEQ PO PACK
60.0000 meq | PACK | Freq: Once | ORAL | Status: AC
  Administered 2023-04-06: 60 meq via ORAL
  Filled 2023-04-06: qty 3

## 2023-04-06 MED ORDER — SENNOSIDES-DOCUSATE SODIUM 8.6-50 MG PO TABS: 1.0000 | ORAL_TABLET | Freq: Every evening | ORAL | Status: DC | PRN

## 2023-04-06 MED ORDER — MAGNESIUM SULFATE 2 GM/50ML IV SOLN
2.0000 g | Freq: Once | INTRAVENOUS | Status: AC
  Administered 2023-04-06: 2 g via INTRAVENOUS
  Filled 2023-04-06: qty 50

## 2023-04-06 MED ORDER — PSEUDOEPHEDRINE HCL 30 MG PO TABS
30.0000 mg | ORAL_TABLET | Freq: Once | ORAL | Status: AC | PRN
  Administered 2023-04-06: 30 mg via ORAL
  Filled 2023-04-06: qty 1

## 2023-04-06 MED ORDER — SODIUM CHLORIDE 0.9% FLUSH
3.0000 mL | Freq: Two times a day (BID) | INTRAVENOUS | Status: DC
  Administered 2023-04-06: 3 mL via INTRAVENOUS

## 2023-04-06 MED ORDER — ENOXAPARIN SODIUM 40 MG/0.4ML IJ SOSY
40.0000 mg | PREFILLED_SYRINGE | INTRAMUSCULAR | Status: DC
  Administered 2023-04-06: 40 mg via SUBCUTANEOUS
  Filled 2023-04-06: qty 0.4

## 2023-04-06 MED ORDER — SODIUM CHLORIDE 0.9 % IV BOLUS
500.0000 mL | Freq: Once | INTRAVENOUS | Status: AC
  Administered 2023-04-06: 500 mL via INTRAVENOUS

## 2023-04-06 MED ORDER — ACETAMINOPHEN 650 MG RE SUPP: 650.0000 mg | Freq: Four times a day (QID) | RECTAL | Status: DC | PRN

## 2023-04-06 MED ORDER — LACTATED RINGERS IV BOLUS
500.0000 mL | Freq: Once | INTRAVENOUS | Status: AC
  Administered 2023-04-06: 500 mL via INTRAVENOUS

## 2023-04-06 MED ORDER — ONDANSETRON HCL 4 MG/2ML IJ SOLN: 4.0000 mg | Freq: Four times a day (QID) | INTRAMUSCULAR | Status: DC | PRN

## 2023-04-06 MED ORDER — ONDANSETRON HCL 4 MG PO TABS: 4.0000 mg | ORAL_TABLET | Freq: Four times a day (QID) | ORAL | Status: DC | PRN

## 2023-04-06 MED ORDER — DIPHENHYDRAMINE HCL 25 MG PO CAPS
25.0000 mg | ORAL_CAPSULE | Freq: Once | ORAL | Status: AC | PRN
  Administered 2023-04-06: 25 mg via ORAL
  Filled 2023-04-06: qty 1

## 2023-04-06 NOTE — ED Notes (Signed)
Kim with cl called for transport

## 2023-04-06 NOTE — ED Provider Notes (Signed)
[place] EMERGENCY DEPARTMENT AT [facility] Provider Note   CSN: [id number] Arrival date & time: 04/06/23  4098     History  Chief Complaint  Patient presents with   Dizziness    Jennifer Hopkins is a 72 y.o. female.  HPI 72 yo female ho breast ca, hypertension presents with 2 weeks of nausea and poor po intake.  She reports that she feels lightheaded and has noticed that her ears are ringing for the past several days.  She denies fever, chills, chest pain, She denies lateralized symptoms or significant balance issues.      Home Medications Prior to Admission medications   Medication Sig Start Date End Date Taking? Authorizing Provider  co-enzyme Q-10 30 MG capsule Take 30 mg by mouth 3 (three) times daily.    [provider]  diphenhydrAMINE (SOMINEX) 25 MG tablet Take 25 mg by mouth at bedtime as needed.    [provider]  donepezil (ARICEPT) 5 MG tablet Take 1 tablet (5 mg total) by mouth at bedtime. 02/10/23   Myrlene Broker, MD  fexofenadine (ALLEGRA) 30 MG tablet Take 30 mg by mouth daily.    [provider]  Flaxseed, Linseed, (FLAX SEED OIL PO) Take by mouth.    [provider]  ibuprofen (ADVIL,MOTRIN) 200 MG tablet Take 200 mg by mouth every 6 (six) hours as needed (pain).    [provider]  Multiple Vitamin (MULTIVITAMIN) tablet Take 1 tablet by mouth daily.    [provider]  nitrofurantoin, macrocrystal-monohydrate, (MACROBID) 100 MG capsule Take 1 capsule (100 mg total) by mouth 2 (two) times daily for 5 days. 04/02/23 04/07/23  Myrlene Broker, MD  OVER THE COUNTER MEDICATION Flaxseek Oil    [provider]  OVER THE COUNTER MEDICATION MIlk Thistle    [provider]  pravastatin (PRAVACHOL) 40 MG tablet Take 1 tablet (40 mg total) by mouth daily. 01/02/23   Myrlene Broker, MD  pseudoephedrine (SUDAFED) 30 MG tablet Take 30 mg by mouth every 4 (four) hours as  needed. Reported on 08/02/2015    [provider]  typhoid (VIVOTIF) DR capsule Take 1 capsule by mouth every other day. 04/02/23   Myrlene Broker, MD  vitamin C (VITAMIN C) 1000 MG tablet Take 1 tablet (1,000 mg total) by mouth daily. 08/18/13   Dominica Severin, MD      Allergies    Patient has no known allergies.    Review of Systems   Review of Systems  Physical Exam Updated Vital Signs BP (!) 168/95 (BP Location: Right Arm)   Pulse 93   Temp 98.3 F (36.8 C) (Oral)   Resp 16   SpO2 96%  Physical Exam Vitals and nursing note reviewed.  Constitutional:      General: She is not in acute distress.    Appearance: She is well-developed.  HENT:     Head: Normocephalic and atraumatic.     Right Ear: External ear normal.     Left Ear: External ear normal.     Nose: Nose normal.  Eyes:     Conjunctiva/sclera: Conjunctivae normal.     Pupils: Pupils are equal, round, and reactive to light.  Cardiovascular:     Rate and Rhythm: Normal rate.  Pulmonary:     Effort: Pulmonary effort is normal.  Abdominal:     General: Abdomen is flat.     Palpations: Abdomen is soft.  Musculoskeletal:  General: Normal range of motion.     Cervical back: Normal range of motion and neck supple.  Skin:    General: Skin is warm and dry.  Neurological:     Mental Status: She is alert and oriented to person, place, and time.     Motor: No abnormal muscle tone.     Coordination: Coordination normal.  Psychiatric:        Behavior: Behavior normal.        Thought Content: Thought content normal.     ED Results / Procedures / Treatments   Labs (all labs ordered are listed, but only abnormal results are displayed) Labs Reviewed  BASIC METABOLIC PANEL - Abnormal; Notable for the following components:      Result Value   Sodium 123 (*)    Potassium 2.6 (*)    Chloride 86 (*)    Glucose, Bld 114 (*)    BUN 6 (*)    All other components within normal limits  CBC - Abnormal;  Notable for the following components:   WBC 10.8 (*)    MCHC 36.2 (*)    All other components within normal limits  URINALYSIS, ROUTINE W REFLEX MICROSCOPIC - Abnormal; Notable for the following components:   Color, Urine COLORLESS (*)    Specific Gravity, Urine <1.005 (*)    Ketones, ur 15 (*)    All other components within normal limits  HEPATIC FUNCTION PANEL - Abnormal; Notable for the following components:   Total Bilirubin 1.5 (*)    Bilirubin, Direct 0.3 (*)    Indirect Bilirubin 1.2 (*)    All other components within normal limits  LIPASE, BLOOD - Abnormal; Notable for the following components:   Lipase <10 (*)    All other components within normal limits  CBG MONITORING, ED    EKG EKG Interpretation Date/Time:  Sunday April 06 2023 10:53:20 EDT Ventricular Rate:  80 PR Interval:  159 QRS Duration:  98 QT Interval:  469 QTC Calculation: 542 R Axis:   0  Text Interpretation: Sinus rhythm nsst Prolonged QT interval Confirmed by Margarita Grizzle [phone]) on 04/06/2023 11:02:35 AM  Radiology CT Head Wo Contrast  Result Date: 04/06/2023 CLINICAL DATA:  Provided history: Headache, new onset, ear pressure, ringing in ears, dizziness, nausea, leg weakness. EXAM: CT HEAD WITHOUT CONTRAST TECHNIQUE: Contiguous axial images were obtained from the base of the skull through the vertex without intravenous contrast. RADIATION DOSE REDUCTION: This exam was performed according to the departmental dose-optimization program which includes automated exposure control, adjustment of the mA and/or kV according to patient size and/or use of iterative reconstruction technique. COMPARISON:  Brain MRI 01/05/2023.  Head CT 06/01/2007. FINDINGS: Brain: Patchy and ill-defined hypoattenuation within the cerebral white matter, nonspecific but compatible with moderate chronic small vessel ischemic disease. There is no acute intracranial hemorrhage. No demarcated cortical infarct. No extra-axial fluid  collection. No evidence of an intracranial mass. No midline shift. Vascular: No hyperdense vessel.  Atherosclerotic calcifications. Skull: No calvarial fracture or aggressive osseous lesion. Sinuses/Orbits: No mass or acute finding within the imaged orbits. Chronic left sphenoid sinusitis. Other: Trace fluid within the bilateral mastoid air cells. IMPRESSION: 1.  No evidence of an acute intracranial abnormality. 2. Moderate chronic small vessel ischemic changes within the cerebral white matter. 3. Chronic left sphenoid sinusitis. 4. Trace fluid within the bilateral mastoid air cells. Electronically Signed   By: Jackey Loge D.O.   On: 04/06/2023 11:14    Procedures .Critical Care  Performed by: Margarita Grizzle, MD Authorized by: Margarita Grizzle, MD   Critical care provider statement:    Critical care time (minutes):  30   Critical care was necessary to treat or prevent imminent or life-threatening deterioration of the following conditions:  Metabolic crisis   Critical care was time spent personally by me on the following activities:  Development of treatment plan with patient or surrogate, discussions with consultants, evaluation of patient's response to treatment, examination of patient, ordering and review of laboratory studies, ordering and review of radiographic studies, ordering and performing treatments and interventions, pulse oximetry, re-evaluation of patient's condition and review of old charts     Medications Ordered in ED Medications  potassium chloride 10 mEq in 100 mL IVPB (10 mEq Intravenous New Bag/Given 04/06/23 1228)  lactated ringers bolus 500 mL (500 mLs Intravenous New Bag/Given 04/06/23 1054)  sodium chloride 0.9 % bolus 500 mL (500 mLs Intravenous New Bag/Given 04/06/23 1229)    ED Course/ Medical Decision Making/ A&P Clinical Course as of 04/06/23 1238  Sun Apr 06, 2023  1216 CT reviewed interpreted no subcu abnormality and radiologist interpretation nose no acute abnormality  however chronic left sphenoid sinusitis [DR]  1216 Basic metabolic panel reviewed interpreted significant for sodium 123 potassium 2.6 and chloride 86 [DR]  1216 Hepatic function panel significant for bilirubin elevated at 1.5 lipase less than 10 CBC reviewed interpreted and within normal limits [DR]    Clinical Course User Index [DR] Margarita Grizzle, MD                                 Medical Decision Making Amount and/or Complexity of Data Reviewed Labs: ordered. Radiology: ordered.  Risk Prescription drug management.   72 year old female history of breast cancer, hypertension, hyperlipidemia, presents today with generalized weakness and ringing in her ears.  She has had poor p.o. intake secondary to nausea and not feeling well for the past couple of weeks.  On evaluation here, EKG with normal sinus rhythm blood pressure has ranged from 1 34-1 61 systolically has decreased from 1 6134 on standing Evaluated with labs and found to have sodium of 123 and potassium of 2.6 with chloride 86 This is consistent with her poor p.o. intake and volume depletion Patient had head CT performed that does not show any evidence of acute bleeding or ischemia. Patient has normal saline and potassium infusing Plan admission for ongoing evaluation and treatment 1 generalized weakness 2 hyponatremia 3 hypokalemia Care discussed with Dr. Alinda Money who accepts patient in transfer        Final Clinical Impression(s) / ED Diagnoses Final diagnoses:  Hyponatremia  Generalized weakness  Hypokalemia    Rx / DC Orders ED Discharge Orders     None         Margarita Grizzle, MD 04/06/23 1238

## 2023-04-06 NOTE — ED Notes (Signed)
Electa Sniff gave report to [facility] transport

## 2023-04-06 NOTE — ED Notes (Signed)
Tequila with  cl called for transport 

## 2023-04-06 NOTE — ED Notes (Signed)
Transport to CT

## 2023-04-06 NOTE — ED Triage Notes (Signed)
Pt reports dizziness, pain in both ears, nausea and weakness in legs for past 4-5 days.  Pt seen by PMD on Weds for a pre-travel evaluation and states no abnormal findings at that time.  Pt reports taking nitrofurantoin that she had at home "for a couple days" without relief.

## 2023-04-06 NOTE — H&P (Signed)
History and Physical    Jennifer Hopkins ZDG:[id number] DOB: 28-May-1951 DOA: 04/06/2023  PCP: Myrlene Broker, MD  Patient coming from: Home  I have personally briefly reviewed patient's old medical records in [facility]  Chief Complaint: Dizziness, nausea, weakness  HPI: Jennifer Hopkins is a 72 y.o. female with medical history significant for HLD who presented to the ED for evaluation of dizziness, nausea, weakness.  Patient states for the last 2 weeks she has had intermittent lightheadedness/dizziness.  This was more pronounced when she would stand up and start walking.  She has not fallen or lost consciousness.  She did not have any chest pain, dyspnea, palpitations.  She says yesterday she developed upset stomach with several episodes of nausea, vomiting, and diarrhea.  Today her dizziness was worse especially when walking.  She noticed ringing in her ears and thought she had an ear infection.  She went to the ED for further evaluation.  She has plans to leave on Thursday for a trip to [place], [place].   [facility] ED Course  Labs/Imaging on admission: I have personally reviewed following labs and imaging studies.  Initial vitals showed BP 153/86, pulse 80, RR 19, temp 98.3 F, SpO2 98% on room air.  Orthostatic vitals were positive.  Labs showed sodium 123, potassium 2.6, bicarb 27, BUN 6, creatinine 0.55, serum glucose 114, WBC 10.8, hemoglobin 13.9, platelets 378,000, lipase <10, magnesium 1.6.  AST 21, ALT 12, alk phos 58, total bilirubin 1.5 (direct 0.3, indirect 1.2).  CT head without contrast negative for acute intracranial abnormality.  Moderate chronic small vessel ischemic changes within the cerebral white matter and chronic left sphenoid sinusitis noted.  Patient received 500 cc LR, 500 cc NS, IV K 10 mEq x 4 runs.  Repeat BMP notable for sodium 130 and potassium 3.0.  The hospitalist service was consulted to admit for further evaluation and  management.  Review of Systems: All systems reviewed and are negative except as documented in history of present illness above.   Past Medical History:  Diagnosis Date   Allergy    Cancer [facility])    Breast   Rheumatic fever     Past Surgical History:  Procedure Laterality Date   BASAL CELL CARCINOMA EXCISION  12/2009   BREAST LUMPECTOMY Right 02/2001   MOHS SURGERY  2001   at [facility]   OPEN REDUCTION INTERNAL FIXATION (ORIF) DISTAL RADIAL FRACTURE Left 08/17/2013   Procedure: OPEN REDUCTION INTERNAL FIXATION (ORIF) DISTAL RADIAL FRACTURE;  Surgeon: Dominica Severin, MD;  Location: [facility] OR;  Service: Orthopedics;  Laterality: Left;    Social History:  reports that she has never smoked. She has never used smokeless tobacco. She reports current alcohol use. She reports that she does not use drugs.  No Known Allergies  Family History  Problem Relation Age of Onset   Cancer Mother        Breast Cancer   Hyperlipidemia Father    Heart disease Father    Hypertension Father    Heart disease Paternal Aunt    Heart disease Paternal Uncle    Heart disease Paternal Grandmother    Heart disease Paternal Grandfather      Prior to Admission medications   Medication Sig Start Date End Date Taking? Authorizing Provider  co-enzyme Q-10 30 MG capsule Take 30 mg by mouth 3 (three) times daily.    [provider]  diphenhydrAMINE (SOMINEX) 25 MG tablet Take 25 mg by mouth at bedtime as  needed.    [provider]  donepezil (ARICEPT) 5 MG tablet Take 1 tablet (5 mg total) by mouth at bedtime. 02/10/23   Myrlene Broker, MD  fexofenadine (ALLEGRA) 30 MG tablet Take 30 mg by mouth daily.    [provider]  Flaxseed, Linseed, (FLAX SEED OIL PO) Take by mouth.    [provider]  ibuprofen (ADVIL,MOTRIN) 200 MG tablet Take 200 mg by mouth every 6 (six) hours as needed (pain).    [provider]  Multiple Vitamin (MULTIVITAMIN) tablet Take 1 tablet by  mouth daily.    [provider]  nitrofurantoin, macrocrystal-monohydrate, (MACROBID) 100 MG capsule Take 1 capsule (100 mg total) by mouth 2 (two) times daily for 5 days. 04/02/23 04/07/23  Myrlene Broker, MD  OVER THE COUNTER MEDICATION Flaxseek Oil    [provider]  OVER THE COUNTER MEDICATION MIlk Thistle    [provider]  pravastatin (PRAVACHOL) 40 MG tablet Take 1 tablet (40 mg total) by mouth daily. 01/02/23   Myrlene Broker, MD  pseudoephedrine (SUDAFED) 30 MG tablet Take 30 mg by mouth every 4 (four) hours as needed. Reported on 08/02/2015    [provider]  typhoid (VIVOTIF) DR capsule Take 1 capsule by mouth every other day. 04/02/23   Myrlene Broker, MD  vitamin C (VITAMIN C) 1000 MG tablet Take 1 tablet (1,000 mg total) by mouth daily. 08/18/13   Dominica Severin, MD    Physical Exam: Vitals:   04/06/23 1400 04/06/23 1644 04/06/23 1645 04/06/23 1824  BP: (!) 148/85 (!) 143/82 (!) 143/82 (!) 152/86  Pulse: 89 87 86 98  Resp: 18 18 19 15   Temp:  98.5 F (36.9 C)  97.8 F (36.6 C)  TempSrc:  Oral  Oral  SpO2: 98% 97% 99% 95%   Constitutional: Resting in bed, NAD, calm, comfortable Eyes: EOMI, lids and conjunctivae normal ENMT: Mucous membranes are moist. Posterior pharynx clear of any exudate or lesions.Normal dentition.  Neck: normal, supple, no masses. Respiratory: clear to auscultation bilaterally, no wheezing, no crackles. Normal respiratory effort. No accessory muscle use.  Cardiovascular: Regular rate and rhythm, no murmurs / rubs / gallops. No extremity edema. 2+ pedal pulses. Abdomen: no tenderness, no masses palpated.  Musculoskeletal: no clubbing / cyanosis. No joint deformity upper and lower extremities. Good ROM, no contractures. Normal muscle tone.  Skin: no rashes, lesions, ulcers. No induration Neurologic: Sensation intact. Strength 5/5 in all 4.  Psychiatric: Normal judgment and insight. Alert and  oriented x 3. Normal mood.   EKG: Personally reviewed. Sinus rhythm, rate 80, artifact throughout.  Assessment/Plan Principal Problem:   Hyponatremia Active Problems:   Hypokalemia   Hyperlipidemia   Hypomagnesemia   Total bilirubin, elevated   Orthostatic hypotension   Jennifer Hopkins is a 72 y.o. female with medical history significant for HLD, mild cognitive impairment who is admitted with hyponatremia and hypokalemia.  Assessment and Plan: Hyponatremia: Initial sodium 123 improved to 130 after initial fluids given in the ED.  Likely secondary to GI losses. -IV NS@50  mL/hour overnight -Repeat labs in AM.  Hypokalemia/hypomagnesemia: Likely secondary to GI losses.  Potassium 2.6 >> 3.0 on repeat.  Magnesium 1.6. -Give IV magnesium supplement -Additional oral potassium supplement tonight -Repeat labs in a.m.  Orthostatic hypotension: Probably ongoing for the last 2 weeks and worsened from volume depletion due to GI losses. -Continue gentle IV fluid hydration overnight -Recheck orthostatic vitals in a.m. -PT eval  Elevated total  bilirubin: Mildly elevated, indirect bilirubin 1.2, direct 0.3.  Unclear significance.  Continue to monitor.  Hyperlipidemia: Continue pravastatin.  Memory issues: Recently started on donepezil as an outpatient which she took for 3 weeks before discontinuing about 1 month ago due to feeling sick while she was taking it.   DVT prophylaxis: enoxaparin (LOVENOX) injection 40 mg Start: 04/06/23 2115 Code Status: Full code, confirmed with patient on admission Family Communication: Discussed with patient, she has discussed with family Disposition Plan: From home and likely discharge to home pending clinical progress Consults called: None Severity of Illness: The appropriate patient status for this patient is OBSERVATION. Observation status is judged to be reasonable and necessary in order to provide the required intensity of service to ensure the  patient's safety. The patient's presenting symptoms, physical exam findings, and initial radiographic and laboratory data in the context of their medical condition is felt to place them at decreased risk for further clinical deterioration. Furthermore, it is anticipated that the patient will be medically stable for discharge from the hospital within 2 midnights of admission.   Darreld Mclean MD [facility]  If 7PM-7AM, please contact night-coverage [email or web address]  04/06/2023, 8:36 PM

## 2023-04-06 NOTE — Hospital Course (Signed)
Jennifer Hopkins is a 72 y.o. female with medical history significant for HLD, mild cognitive impairment who is admitted with hyponatremia and hypokalemia.

## 2023-04-06 NOTE — ED Notes (Signed)
Orthostatic vital signs

## 2023-04-06 NOTE — ED Notes (Signed)
Pt states cannot void at this time, have started IVF

## 2023-04-07 ENCOUNTER — Other Ambulatory Visit (HOSPITAL_COMMUNITY): Payer: Self-pay

## 2023-04-07 DIAGNOSIS — E871 Hypo-osmolality and hyponatremia: Secondary | ICD-10-CM | POA: Diagnosis not present

## 2023-04-07 LAB — COMPREHENSIVE METABOLIC PANEL
ALT: 13 U/L (ref 0–44)
AST: 24 U/L (ref 15–41)
Albumin: 3.4 g/dL — ABNORMAL LOW (ref 3.5–5.0)
Alkaline Phosphatase: 52 U/L (ref 38–126)
Anion gap: 11 (ref 5–15)
BUN: 6 mg/dL — ABNORMAL LOW (ref 8–23)
CO2: 21 mmol/L — ABNORMAL LOW (ref 22–32)
Calcium: 8.7 mg/dL — ABNORMAL LOW (ref 8.9–10.3)
Chloride: 98 mmol/L (ref 98–111)
Creatinine, Ser: 0.58 mg/dL (ref 0.44–1.00)
GFR, Estimated: >60 mL/min (ref >60)
Glucose, Bld: 94 mg/dL (ref 70–99)
Potassium: 3.8 mmol/L (ref 3.5–5.1)
Sodium: 130 mmol/L — ABNORMAL LOW (ref 135–145)
Total Bilirubin: 1.4 mg/dL — ABNORMAL HIGH (ref 0.3–1.2)
Total Protein: 6.6 g/dL (ref 6.5–8.1)

## 2023-04-07 LAB — CBC
HCT: 38.1 % (ref 36.0–46.0)
Hemoglobin: 13.2 g/dL (ref 12.0–15.0)
MCH: 29.9 pg (ref 26.0–34.0)
MCHC: 34.6 g/dL (ref 30.0–36.0)
MCV: 86.4 fL (ref 80.0–100.0)
Platelets: 332 10*3/uL (ref 150–400)
RBC: 4.41 MIL/uL (ref 3.87–5.11)
RDW: 12.5 % (ref 11.5–15.5)
WBC: 9.3 10*3/uL (ref 4.0–10.5)
nRBC: 0 % (ref 0.0–0.2)

## 2023-04-07 LAB — MAGNESIUM: Magnesium: 2.4 mg/dL (ref 1.7–2.4)

## 2023-04-07 MED ORDER — ONDANSETRON HCL 4 MG PO TABS: 4.0000 mg | ORAL_TABLET | Freq: Three times a day (TID) | ORAL | 0 refills | Status: AC | PRN

## 2023-04-07 MED ORDER — LOPERAMIDE HCL 2 MG PO CAPS
2.0000 mg | ORAL_CAPSULE | Freq: Three times a day (TID) | ORAL | 0 refills | Status: AC | PRN
  Filled 2023-04-07: qty 12, 4d supply, fill #0

## 2023-04-07 NOTE — Discharge Summary (Signed)
Reagin Labranche Diener ZOX:[id number] DOB: [birth year] DOA: 04/06/2023  PCP: Myrlene Broker, MD  Admit date: 04/06/2023  Discharge date: 04/07/2023  Admitted From: Home   Disposition:  Home   Recommendations for Outpatient Follow-up:   Follow up with PCP in 1-2 weeks  PCP Please obtain BMP/CBC, 2 view CXR in 1week,  (see Discharge instructions)   PCP Please follow up on the following pending results: Check blood pressure, orthostatic blood pressures, CBC, CMP and magnesium in 2 to 3 days.   Home Health: None   Equipment/Devices: None  Consultations: None  Discharge Condition: Stable    CODE STATUS: Full    Diet Recommendation: Heart Healthy     Chief Complaint  Patient presents with   Dizziness     Brief history of present illness from the day of admission and additional interim summary    72 y.o. female with medical history significant for HLD who presented to the ED for evaluation of dizziness, nausea, weakness.   Patient states for the last 2 weeks she has had intermittent lightheadedness/dizziness along with some gastroenteritis like symptoms off and on.  This was more pronounced when she would stand up and start walking.  She has not fallen or lost consciousness.  She did not have any chest pain, dyspnea, palpitations.  Came to the ER where she was found to be extremely dehydrated with orthostatic hypotension and admitted to the hospital.                                                                 Hospital Course   Severe dehydration caused by poor oral intake and gastroenteritis causing hyponatremia, hypokalemia and hypomagnesemia.  Hydrated with IV fluids, electrolytes replaced, not dizzy anymore and symptom-free, blood pressure is improved, gastroenteritis symptoms have resolved, eager to go home,  will be discharged home on as needed nausea and diarrhea medications with outpatient follow-up with PCP in couple of days.  She wants to fly on a vacation on Thursday requested to see her PCP a day before.  Borderline top normal total bilirubin.  Symptom-free, PCP to repeat CMP next visit.    Dyslipidemia.  On statin.  Mild memory loss.  Defer it to PCP she says she does not like taking Aricept.   Discharge diagnosis     Principal Problem:   Hyponatremia Active Problems:   Hypokalemia   Hyperlipidemia   Hypomagnesemia   Total bilirubin, elevated   Orthostatic hypotension    Discharge instructions    Discharge Instructions     Discharge instructions   Complete by: As directed    Follow with Primary MD Myrlene Broker, MD in 2 days   Get CBC, CMP, Magnesium -  checked next visit with your primary MD   Activity: As tolerated with Full fall  precautions use walker/cane & assistance as needed  Disposition Home    Diet: Heart Healthy   Special Instructions: If you have smoked or chewed Tobacco  in the last 2 yrs please stop smoking, stop any regular Alcohol  and or any Recreational drug use.  On your next visit with your primary care physician please Get Medicines reviewed and adjusted.  Please request your Prim.MD to go over all Hospital Tests and Procedure/Radiological results at the follow up, please get all Hospital records sent to your Prim MD by signing hospital release before you go home.  If you experience worsening of your admission symptoms, develop shortness of breath, life threatening emergency, suicidal or homicidal thoughts you must seek medical attention immediately by calling 911 or calling your MD immediately  if symptoms less severe.  You Must read complete instructions/literature along with all the possible adverse reactions/side effects for all the Medicines you take and that have been prescribed to you. Take any new Medicines after you have  completely understood and accpet all the possible adverse reactions/side effects.   Do not drive when taking Pain medications.  Do not take more than prescribed Pain, Sleep and Anxiety Medications   Increase activity slowly   Complete by: As directed        Discharge Medications   Allergies as of 04/07/2023   No Known Allergies      Medication List     STOP taking these medications    nitrofurantoin (macrocrystal-monohydrate) 100 MG capsule Commonly known as: Macrobid   Vivotif DR capsule Generic drug: typhoid       TAKE these medications    co-enzyme Q-10 30 MG capsule Take 30 mg by mouth 3 (three) times daily.   diphenhydrAMINE 25 MG tablet Commonly known as: SOMINEX Take 25 mg by mouth at bedtime as needed for allergies or sleep.   donepezil 5 MG tablet Commonly known as: ARICEPT Take 1 tablet (5 mg total) by mouth at bedtime.   fexofenadine-pseudoephedrine 60-120 MG 12 hr tablet Commonly known as: ALLEGRA-D Take 1 tablet by mouth 2 (two) times daily.   FLAX SEED OIL PO Take 1 tablet by mouth daily.   ibuprofen 200 MG tablet Commonly known as: ADVIL Take 200 mg by mouth every 6 (six) hours as needed (pain).   loperamide 2 MG tablet Commonly known as: Imodium A-D Take 1 tablet (2 mg total) by mouth 3 (three) times daily as needed for diarrhea or loose stools.   milk thistle 175 MG tablet Take 175 mg by mouth daily.   multivitamin tablet Take 1 tablet by mouth daily.   ondansetron 4 MG tablet Commonly known as: Zofran Take 1 tablet (4 mg total) by mouth every 8 (eight) hours as needed for nausea or vomiting.   pravastatin 40 MG tablet Commonly known as: PRAVACHOL Take 1 tablet (40 mg total) by mouth daily.   pseudoephedrine 30 MG tablet Commonly known as: SUDAFED Take 30 mg by mouth every 4 (four) hours as needed. Reported on 08/02/2015         Follow-up Information     Myrlene Broker, MD. Schedule an appointment as soon as  possible for a visit in 2 day(s).   Specialty: Internal Medicine Contact information: [address] [place] [address] [phone]                 Major procedures and Radiology Reports - PLEASE review detailed and final reports thoroughly  -  CT Head Wo Contrast  Result Date: 04/06/2023 CLINICAL DATA:  Provided history: Headache, new onset, ear pressure, ringing in ears, dizziness, nausea, leg weakness. EXAM: CT HEAD WITHOUT CONTRAST TECHNIQUE: Contiguous axial images were obtained from the base of the skull through the vertex without intravenous contrast. RADIATION DOSE REDUCTION: This exam was performed according to the departmental dose-optimization program which includes automated exposure control, adjustment of the mA and/or kV according to patient size and/or use of iterative reconstruction technique. COMPARISON:  Brain MRI 01/05/2023.  Head CT 06/01/2007. FINDINGS: Brain: Patchy and ill-defined hypoattenuation within the cerebral white matter, nonspecific but compatible with moderate chronic small vessel ischemic disease. There is no acute intracranial hemorrhage. No demarcated cortical infarct. No extra-axial fluid collection. No evidence of an intracranial mass. No midline shift. Vascular: No hyperdense vessel.  Atherosclerotic calcifications. Skull: No calvarial fracture or aggressive osseous lesion. Sinuses/Orbits: No mass or acute finding within the imaged orbits. Chronic left sphenoid sinusitis. Other: Trace fluid within the bilateral mastoid air cells. IMPRESSION: 1.  No evidence of an acute intracranial abnormality. 2. Moderate chronic small vessel ischemic changes within the cerebral white matter. 3. Chronic left sphenoid sinusitis. 4. Trace fluid within the bilateral mastoid air cells. Electronically Signed   By: Jackey Loge D.O.   On: 04/06/2023 11:14    Micro Results     No results found for this or any previous visit (from the past 240 hour(s)).  Today    Subjective    June Leap today has no headache,no chest abdominal pain,no new weakness tingling or numbness, feels much better wants to go home today.     Objective   Blood pressure 122/73, pulse 80, temperature 98 F (36.7 C), resp. rate 16, SpO2 91%.   Intake/Output Summary (Last 24 hours) at 04/07/2023 0848 Last data filed at 04/06/2023 2207 Gross per 24 hour  Intake 3 ml  Output --  Net 3 ml    Exam  Awake Alert, No new F.N deficits,    [place].AT,PERRAL Supple Neck,   Symmetrical Chest wall movement, Good air movement bilaterally, CTAB RRR,No Gallops,   +ve B.Sounds, Abd Soft, Non tender,  No Cyanosis, Clubbing or edema    Data Review   Recent Labs  Lab 04/06/23 1042 04/07/23 0402  WBC 10.8* 9.3  HGB 13.9 13.2  HCT 38.4 38.1  PLT 378 332  MCV 82.6 86.4  MCH 29.9 29.9  MCHC 36.2* 34.6  RDW 12.1 12.5    Recent Labs  Lab 04/06/23 1042 04/06/23 1648 04/07/23 0402  NA 123* 130* 130*  K 2.6* 3.0* 3.8  CL 86* 93* 98  CO2 27 28 21*  ANIONGAP 10 9 11   GLUCOSE 114* 116* 94  BUN 6* 5* 6*  CREATININE 0.55 0.50 0.58  AST 21  --  24  ALT 12  --  13  ALKPHOS 58  --  52  BILITOT 1.5*  --  1.4*  ALBUMIN 4.5  --  3.4*  MG 1.6*  --  2.4  CALCIUM 9.9 9.5 8.7*    Total Time in preparing paper work, data evaluation and todays exam - 35 minutes  Signature  -    Susa Raring M.D on 04/07/2023 at 8:48 AM   -  To page go to [email or web address]

## 2023-04-07 NOTE — Plan of Care (Signed)

## 2023-04-07 NOTE — Evaluation (Signed)
Physical Therapy Brief Evaluation and Discharge Note Patient Details Name: Jennifer Hopkins MRN: [id number] DOB: 05-Aug-1950 Today's Date: 04/07/2023   History of Present Illness  72 y.o. female who presented to the ED 04/06/23 for evaluation of dizziness, nausea, weakness. CT head  negative for acute intracranial abnormality with chronic left sphenoid sinusitis noted. +orthostasis PMH significant for HLD, breast Ca,  Clinical Impression   Patient evaluated by Physical Therapy with no further acute PT needs identified. PT is signing off. Thank you for this referral.        PT Assessment Patient does not need any further PT services  Assistance Needed at Discharge  None    Equipment Recommendations None recommended by PT  Recommendations for Other Services       Precautions/Restrictions Precautions Precautions: None Restrictions Weight Bearing Restrictions: No        Mobility  Bed Mobility Rolling: Independent Supine/Sidelying to sit: Independent      Transfers Overall transfer level: Independent Equipment used: None                    Ambulation/Gait Ambulation/Gait assistance: Independent Gait Distance (Feet): 300 Feet Assistive device: None Gait Pattern/deviations: WFL(Within Functional Limits) Gait Speed: Pace WFL General Gait Details: see DGI  Home Activity Instructions    Stairs            Modified Rankin (Stroke Patients Only)        Balance Overall balance assessment: Independent               Standardized Balance Assessment Standardized Balance Assessment : Dynamic Gait Index Dynamic Gait Index Level Surface: Normal Change in Gait Speed: Normal Gait with Horizontal Head Turns: Normal Gait with Vertical Head Turns: Normal Gait and Pivot Turn: Normal Step Over Obstacle: Normal Step Around Obstacles: Normal Steps: Mild Impairment Total Score: 23      Pertinent Vitals/Pain   Pain Assessment Pain Assessment:  No/denies pain     Home Living Family/patient expects to be discharged to:: Private residence Living Arrangements: Alone Available Help at Discharge: Family;Available PRN/intermittently Home Environment: Stairs to enter;Stairs in home;Rail - right;Rail - left  Stairs-Number of Steps: 3 Home Equipment: None   Additional Comments: bedroom on second level    Prior Function Level of Independence: Independent      UE/LE Assessment   UE ROM/Strength/Tone/Coordination: WFL    LE ROM/Strength/Tone/Coordination: [facility]      Communication   Communication Communication: No apparent difficulties     Cognition Overall Cognitive Status: Appears within functional limits for tasks assessed/performed       General Comments General comments (skin integrity, edema, etc.): see vitals flowsheet for orthostatic BPs    Exercises     Assessment/Plan    PT Problem List         PT Visit Diagnosis Dizziness and giddiness (R42)    No Skilled PT Patient at baseline level of functioning;Patient is independent with all acitivity/mobility   Co-evaluation                AMPAC 6 Clicks Help needed turning from your back to your side while in a flat bed without using bedrails?: None Help needed moving from lying on your back to sitting on the side of a flat bed without using bedrails?: None Help needed moving to and from a bed to a chair (including a wheelchair)?: None Help needed standing up from a chair using your arms (e.g., wheelchair or bedside chair)?: None Help needed  to walk in hospital room?: None Help needed climbing 3-5 steps with a railing? : None 6 Click Score: 24      End of Session   Activity Tolerance: Patient tolerated treatment well Patient left: in chair;with call bell/phone within reach Nurse Communication: Mobility status PT Visit Diagnosis: Dizziness and giddiness (R42)     Time: 8413-2440 PT Time Calculation (min) (ACUTE ONLY): 26 min  Charges:   PT  Evaluation $PT Eval Low Complexity: 1 Low PT Treatments $Gait Training: 8-22 mins     Jerolyn Center, PT Acute Rehabilitation Services  Office [phone]   Zena Amos  04/07/2023, 10:46 AM

## 2023-04-07 NOTE — TOC Transition Note (Signed)
Transition of Care [facility]) - CM/SW Discharge Note   Patient Details  Name: Jennifer Hopkins MRN: [id number] Date of Birth: [birth year]  [facility] CM/SW Contact:  Gordy Clement, RN Phone Number: 04/07/2023, 1:08 PM   Clinical Narrative:      Patient to DC to home today - No additional TOC needs .  This CM has delivered the Code 44 letter to patient           Patient Goals and CMS Choice      Discharge Placement                         Discharge Plan and Services Additional resources added to the After Visit Summary for                                       Social Determinants of Health (SDOH) Interventions SDOH Screenings   Food Insecurity: No Food Insecurity (04/06/2023)  Housing: Patient Declined (04/06/2023)  Transportation Needs: No Transportation Needs (04/06/2023)  Utilities: Not At Risk (04/06/2023)  Alcohol Screen: Low Risk  (11/14/2022)  Depression (PHQ2-9): Low Risk  (02/10/2023)  Financial Resource Strain: Low Risk  (11/14/2022)  Physical Activity: Sufficiently Active (11/14/2022)  Social Connections: Moderately Isolated (11/14/2022)  Stress: No Stress Concern Present (11/14/2022)  Tobacco Use: Low Risk  (04/02/2023)     Readmission Risk Interventions     No data to display

## 2023-04-07 NOTE — Discharge Instructions (Signed)
Follow with Primary MD Myrlene Broker, MD in 2 days   Get CBC, CMP, Magnesium -  checked next visit with your primary MD   Activity: As tolerated with Full fall precautions use walker/cane & assistance as needed  Disposition Home    Diet: Heart Healthy   Special Instructions: If you have smoked or chewed Tobacco  in the last 2 yrs please stop smoking, stop any regular Alcohol  and or any Recreational drug use.  On your next visit with your primary care physician please Get Medicines reviewed and adjusted.  Please request your Prim.MD to go over all Hospital Tests and Procedure/Radiological results at the follow up, please get all Hospital records sent to your Prim MD by signing hospital release before you go home.  If you experience worsening of your admission symptoms, develop shortness of breath, life threatening emergency, suicidal or homicidal thoughts you must seek medical attention immediately by calling 911 or calling your MD immediately  if symptoms less severe.  You Must read complete instructions/literature along with all the possible adverse reactions/side effects for all the Medicines you take and that have been prescribed to you. Take any new Medicines after you have completely understood and accpet all the possible adverse reactions/side effects.   Do not drive when taking Pain medications.  Do not take more than prescribed Pain, Sleep and Anxiety Medications

## 2023-04-07 NOTE — Care Management Obs Status (Signed)
MEDICARE OBSERVATION STATUS NOTIFICATION   Patient Details  Name: Jennifer Hopkins MRN: [id number] Date of Birth: [birth year]   Medicare Observation Status Notification Given:  Yes    Ronny Bacon, RN 04/07/2023, 12:53 PM

## 2023-04-15 ENCOUNTER — Telehealth: Payer: Self-pay | Admitting: Hematology and Oncology

## 2023-04-15 NOTE — Telephone Encounter (Signed)
04/15/23: Due to a change in the provider schedule, I called the patient and left a voice mail with the details of the rescheduled appointment. I mailed a reminder notice with the new appointment date and time.

## 2023-04-28 ENCOUNTER — Ambulatory Visit: Payer: Medicare Other | Admitting: Plastic Surgery

## 2023-04-28 ENCOUNTER — Ambulatory Visit: Payer: Medicare Other | Admitting: Hematology and Oncology

## 2023-04-30 DIAGNOSIS — D485 Neoplasm of uncertain behavior of skin: Secondary | ICD-10-CM | POA: Diagnosis not present

## 2023-04-30 DIAGNOSIS — C44719 Basal cell carcinoma of skin of left lower limb, including hip: Secondary | ICD-10-CM | POA: Diagnosis not present

## 2023-04-30 DIAGNOSIS — Z86007 Personal history of in-situ neoplasm of skin: Secondary | ICD-10-CM | POA: Diagnosis not present

## 2023-04-30 DIAGNOSIS — Z08 Encounter for follow-up examination after completed treatment for malignant neoplasm: Secondary | ICD-10-CM | POA: Diagnosis not present

## 2023-05-09 DIAGNOSIS — C44719 Basal cell carcinoma of skin of left lower limb, including hip: Secondary | ICD-10-CM | POA: Diagnosis not present

## 2023-06-03 ENCOUNTER — Inpatient Hospital Stay: Payer: Medicare Other | Attending: Hematology and Oncology | Admitting: Hematology and Oncology

## 2023-06-03 VITALS — BP 142/84 | HR 90 | Temp 98.0°F | Resp 18 | Ht 62.0 in | Wt 119.5 lb

## 2023-06-03 DIAGNOSIS — M810 Age-related osteoporosis without current pathological fracture: Secondary | ICD-10-CM | POA: Insufficient documentation

## 2023-06-03 DIAGNOSIS — Z9221 Personal history of antineoplastic chemotherapy: Secondary | ICD-10-CM | POA: Diagnosis not present

## 2023-06-03 DIAGNOSIS — Z853 Personal history of malignant neoplasm of breast: Secondary | ICD-10-CM | POA: Diagnosis not present

## 2023-06-03 DIAGNOSIS — Z923 Personal history of irradiation: Secondary | ICD-10-CM | POA: Insufficient documentation

## 2023-06-03 DIAGNOSIS — R634 Abnormal weight loss: Secondary | ICD-10-CM | POA: Diagnosis not present

## 2023-06-03 NOTE — Progress Notes (Signed)
Patient Care Team: Myrlene Broker, MD as PCP - General (Internal Medicine)  DIAGNOSIS:  Encounter Diagnosis  Name Primary?   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   11/10/2000 Surgery   Sentinel lymph node study for lymph nodes negative   12/08/2000 - 01/28/2001 Neo-Adjuvant Chemotherapy   4 cycles of Adriamycin and Taxotere   02/06/2001 Surgery   Lumpectomy showed residual IMC and DCIS 1.5 cm   03/10/2001 - 04/20/2001 Chemotherapy   CMF chemotherapy    Radiation Therapy   Radiation therapy to the right breast   05/10/2001 - 05/10/2006 Anti-estrogen oral therapy   Tamoxifen 20 mg daily for 5 years   01/09/2010 Relapse/Recurrence   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 Roselind Messier     CHIEF COMPLIANT: Surveillance of breast cancer  HISTORY OF PRESENT ILLNESS:  History of Present Illness   The patient, a 22-year breast cancer survivor, presents with recent memory issues and electrolyte imbalance. She reports a recent brain MRI, which was normal, but was told she could have better brain circulation. In an attempt to improve her circulatory system, she started a low carb, low fat diet, unintentionally losing nine pounds and causing a significant drop in her electrolytes. This resulted in feeling unwell for a period of time. She has since been taking potassium and magnesium supplements and drinking Gatorade to replenish her sodium levels. She reports feeling much better now and is trying to regain the lost weight.  The  patient also reports a recent change in medication. She was prescribed donepezil for her memory issues, but had to discontinue it after three weeks due to severe sickness.         ALLERGIES:  has No Known Allergies.  MEDICATIONS:  Current Outpatient Medications  Medication Sig Dispense Refill   co-enzyme Q-10 30 MG capsule Take 30 mg by mouth 3 (three) times daily.     diphenhydrAMINE (SOMINEX) 25 MG tablet Take 25 mg by mouth at bedtime as needed for allergies or sleep.     fexofenadine-pseudoephedrine (ALLEGRA-D) 60-120 MG 12 hr tablet Take 1 tablet by mouth 2 (two) times daily.     Flaxseed, Linseed, (FLAX SEED OIL PO) Take 1 tablet by mouth daily.     ibuprofen (ADVIL,MOTRIN) 200 MG tablet Take 200 mg by mouth every 6 (six) hours as needed (pain).     loperamide (IMODIUM A-D) 2 MG capsule Take 1 capsule (2 mg total) by mouth 3 (three) times daily as needed for diarrhea or loose stools. 12 capsule 0   milk thistle 175 MG tablet Take 175 mg by mouth daily.     Multiple Vitamin (MULTIVITAMIN) tablet Take 1 tablet by mouth daily.     ondansetron (ZOFRAN) 4 MG tablet Take 1 tablet (4 mg total) by mouth every 8 (eight) hours as needed for nausea or vomiting. 20 tablet 0   pravastatin (PRAVACHOL) 40 MG tablet Take 1 tablet (40 mg total) by mouth daily. 90 tablet 1   pseudoephedrine (SUDAFED) 30 MG tablet Take 30  mg by mouth every 4 (four) hours as needed. Reported on 08/02/2015     No current facility-administered medications for this visit.    PHYSICAL EXAMINATION: ECOG PERFORMANCE STATUS: 1 - Symptomatic but completely ambulatory  Vitals:   06/03/23 1033  BP: (!) 142/84  Pulse: 90  Resp: 18  Temp: 98 F (36.7 C)  SpO2: 99%   Filed Weights   06/03/23 1033  Weight: 119 lb 8 oz (54.2 kg)    Physical Exam   MEASUREMENTS: WT- 125      (exam performed in the presence of a chaperone)  LABORATORY DATA:  I have reviewed the data as listed    Latest Ref Rng & Units 04/07/2023     4:02 AM 04/06/2023    4:48 PM 04/06/2023   10:42 AM  CMP  Glucose 70 - 99 mg/dL 94  784  696   BUN 8 - 23 mg/dL 6  5  6    Creatinine 0.44 - 1.00 mg/dL 2.95  2.84  1.32   Sodium 135 - 145 mmol/L 130  130  123   Potassium 3.5 - 5.1 mmol/L 3.8  3.0  2.6   Chloride 98 - 111 mmol/L 98  93  86   CO2 22 - 32 mmol/L 21  28  27    Calcium 8.9 - 10.3 mg/dL 8.7  9.5  9.9   Total Protein 6.5 - 8.1 g/dL 6.6   7.7   Total Bilirubin 0.3 - 1.2 mg/dL 1.4   1.5   Alkaline Phos 38 - 126 U/L 52   58   AST 15 - 41 U/L 24   21   ALT 0 - 44 U/L 13   12     Lab Results  Component Value Date   WBC 9.3 04/07/2023   HGB 13.2 04/07/2023   HCT 38.1 04/07/2023   MCV 86.4 04/07/2023   PLT 332 04/07/2023   NEUTROABS 6.0 03/10/2014    ASSESSMENT & PLAN:  HX: breast cancer Right breast cancer diagnosed in 2002 and treated with neoadjuvant chemotherapy followed by surgery followed by additional adjuvant chemotherapy followed by radiation and antiestrogen therapy that was completed in 2007.   Breast Cancer Surveillance: 1. Breast exam 06/03/2023: Benign 2. Mammogram 04/16/2022 benign. Postsurgical changes. Breast Density Category C.    Bone density 10/25/2020: T score -2.6: Osteoporosis: Continue with Fosamax (T score used to be -3.2) Brain MRI 01/06/2023: Benign Patient will need new mammogram this year.   patient loves to travel She will travel to [place] and to [place] recently.   Return to clinic in 1 year to follow with me      No orders of the defined types were placed in this encounter.  The patient has a good understanding of the overall plan. she agrees with it. she will call with any problems that may develop before the next visit here. Total time spent: 30 mins including face to face time and time spent for planning, charting and co-ordination of care   Tamsen Meek, MD 06/03/23

## 2023-06-03 NOTE — Assessment & Plan Note (Addendum)
Right breast cancer diagnosed in 2002 and treated with neoadjuvant chemotherapy followed by surgery followed by additional adjuvant chemotherapy followed by radiation and antiestrogen therapy that was completed in 2007.   Breast Cancer Surveillance: 1. Breast exam 06/03/2023: Benign 2. Mammogram 04/16/2022 benign. Postsurgical changes. Breast Density Category C.    Bone density 10/25/2020: T score -2.6: Osteoporosis: Continue with Fosamax (T score used to be -3.2) Brain MRI 01/06/2023: Benign Patient will need new mammogram this year.   patient loves to travel She will travel to [place] and to [place] recently.   Return to clinic in 1 year to follow with me

## 2023-06-13 ENCOUNTER — Other Ambulatory Visit: Payer: Self-pay | Admitting: Internal Medicine

## 2023-06-27 ENCOUNTER — Other Ambulatory Visit: Payer: Self-pay | Admitting: Internal Medicine

## 2023-08-13 DIAGNOSIS — L821 Other seborrheic keratosis: Secondary | ICD-10-CM | POA: Diagnosis not present

## 2023-08-13 DIAGNOSIS — Z85828 Personal history of other malignant neoplasm of skin: Secondary | ICD-10-CM | POA: Diagnosis not present

## 2023-08-13 DIAGNOSIS — L814 Other melanin hyperpigmentation: Secondary | ICD-10-CM | POA: Diagnosis not present

## 2023-08-13 DIAGNOSIS — Z08 Encounter for follow-up examination after completed treatment for malignant neoplasm: Secondary | ICD-10-CM | POA: Diagnosis not present

## 2023-08-18 ENCOUNTER — Other Ambulatory Visit: Payer: Self-pay | Admitting: Internal Medicine

## 2023-09-08 ENCOUNTER — Ambulatory Visit: Payer: Medicare Other | Admitting: Plastic Surgery

## 2023-09-10 ENCOUNTER — Ambulatory Visit: Payer: Self-pay | Admitting: Plastic Surgery

## 2023-09-10 VITALS — BP 133/84 | HR 92

## 2023-09-10 DIAGNOSIS — Q178 Other specified congenital malformations of ear: Secondary | ICD-10-CM

## 2023-09-10 NOTE — Progress Notes (Signed)
 Jennifer Hopkins returns today approximately 6 months after having her right earlobe repaired for re- piercing of her ear.  After confirming that she wanted the procedure done I marked the right earlobe and she approved the position.  The earlobe was cleaned with alcohol and the ear pierced without difficulty.  She was happy with the position of the new earring.  We discussed care including leaving the earring in place for 6 weeks and turning the ear ring daily as well as cleaning it with peroxide.  Follow-up as needed.

## 2023-12-10 ENCOUNTER — Telehealth: Payer: Self-pay | Admitting: Internal Medicine

## 2023-12-10 NOTE — Telephone Encounter (Signed)
 Good afternoon Amy,   Can you please look into this patient's chargers for 11/14/2022. She states that she was bill for her AWV last year $150.00 dollars that she paid. I informed her that she should have not been charged for her AWV. Asked her if it could be for something else she had done, she stated no.  Thanks, Amie Bald Care Guide [facility] AWV TEAM Direct Dial: [phone]

## 2024-01-01 ENCOUNTER — Ambulatory Visit

## 2024-01-01 ENCOUNTER — Other Ambulatory Visit: Payer: Self-pay | Admitting: Internal Medicine

## 2024-01-01 VITALS — Ht 62.0 in | Wt 125.0 lb

## 2024-01-01 DIAGNOSIS — Z853 Personal history of malignant neoplasm of breast: Secondary | ICD-10-CM | POA: Diagnosis not present

## 2024-01-01 DIAGNOSIS — Z1159 Encounter for screening for other viral diseases: Secondary | ICD-10-CM | POA: Diagnosis not present

## 2024-01-01 DIAGNOSIS — Z0000 Encounter for general adult medical examination without abnormal findings: Secondary | ICD-10-CM

## 2024-01-01 NOTE — Progress Notes (Signed)
 Subjective:  Please attest and cosign this visit due to patients primary care provider not being in the office at the time the visit was completed.  (Pt of Dr Almarie Cleveland)   Jennifer Hopkins is a 72 y.o. who presents for a Medicare Wellness preventive visit.  As a reminder, Annual Wellness Visits don't include a physical exam, and some assessments may be limited, especially if this visit is performed virtually. We may recommend an in-person follow-up visit with your provider if needed.  Visit Complete: Virtual I connected with  Jennifer Hopkins on 01/01/24 by a audio enabled telemedicine application and verified that I am speaking with the correct person using two identifiers.  Patient Location: Home  Provider Location: Office/Clinic  I discussed the limitations of evaluation and management by telemedicine. The patient expressed understanding and agreed to proceed.  Vital Signs: Because this visit was a virtual/telehealth visit, some criteria may be missing or patient reported. Any vitals not documented were not able to be obtained and vitals that have been documented are patient reported.  VideoDeclined- This patient declined Librarian, academic. Therefore the visit was completed with audio only.  Persons Participating in Visit: Patient.  AWV Questionnaire: No: Patient Medicare AWV questionnaire was not completed prior to this visit.  Cardiac Risk Factors include: advanced age (>33men, >74 women);dyslipidemia     Objective:    Today's Vitals   01/01/24 1012  Weight: 125 lb (56.7 kg)  Height: 5' 2 (1.575 m)   Body mass index is 22.86 kg/m.     01/01/2024   10:11 AM 04/06/2023   10:09 AM 11/14/2022   10:56 AM 11/02/2021   10:11 AM 03/14/2016    8:44 AM 03/16/2015    9:12 AM 08/17/2013    9:00 PM  Advanced Directives  Does Patient Have a Medical Advance Directive? Yes No No No No  No  Patient does not have advance directive   Type of Scientist, physiological Power of [place];Living will        Does patient want to make changes to medical advance directive? No - Patient declined        Copy of Healthcare Power of Attorney in Chart? Yes - validated most recent copy scanned in chart (See row information)        Would patient like information on creating a medical advance directive?   No - Patient declined No - Patient declined  No - patient declined information    Pre-existing out of facility DNR order (yellow form or pink MOST form)       No      Data saved with a previous flowsheet row definition    Current Medications (verified) Outpatient Encounter Medications as of 01/01/2024  Medication Sig   co-enzyme Q-10 30 MG capsule Take 30 mg by mouth 3 (three) times daily.   diphenhydrAMINE  (SOMINEX) 25 MG tablet Take 25 mg by mouth at bedtime as needed for allergies or sleep.   fexofenadine-pseudoephedrine  (ALLEGRA-D) 60-120 MG 12 hr tablet Take 1 tablet by mouth 2 (two) times daily.   Flaxseed, Linseed, (FLAX SEED OIL PO) Take 1 tablet by mouth daily.   ibuprofen (ADVIL,MOTRIN) 200 MG tablet Take 200 mg by mouth every 6 (six) hours as needed (pain).   loperamide  (IMODIUM  A-D) 2 MG capsule Take 1 capsule (2 mg total) by mouth 3 (three) times daily as needed for diarrhea or loose stools.   milk thistle 175 MG tablet Take 175 mg  by mouth daily.   Multiple Vitamin (MULTIVITAMIN) tablet Take 1 tablet by mouth daily.   ondansetron  (ZOFRAN ) 4 MG tablet Take 1 tablet (4 mg total) by mouth every 8 (eight) hours as needed for nausea or vomiting.   pravastatin  (PRAVACHOL ) 40 MG tablet TAKE 1 TABLET BY MOUTH EVERY DAY   pseudoephedrine  (SUDAFED) 30 MG tablet Take 30 mg by mouth every 4 (four) hours as needed. Reported on 08/02/2015   No facility-administered encounter medications on file as of 01/01/2024.    Allergies (verified) Patient has no known allergies.   History: Past Medical History:  Diagnosis Date   Allergy    Cancer (HCC)     Breast   Rheumatic fever    Past Surgical History:  Procedure Laterality Date   BASAL CELL CARCINOMA EXCISION  12/2009   BREAST LUMPECTOMY Right 02/2001   MOHS SURGERY  2001   at [facility]   OPEN REDUCTION INTERNAL FIXATION (ORIF) DISTAL RADIAL FRACTURE Left 08/17/2013   Procedure: OPEN REDUCTION INTERNAL FIXATION (ORIF) DISTAL RADIAL FRACTURE;  Surgeon: Elsie Mussel, MD;  Location: [facility] OR;  Service: Orthopedics;  Laterality: Left;   Family History  Problem Relation Age of Onset   Cancer Mother        Breast Cancer   Hyperlipidemia Father    Heart disease Father    Hypertension Father    Heart disease Paternal Aunt    Heart disease Paternal Uncle    Heart disease Paternal Grandmother    Heart disease Paternal Grandfather    Social History   Socioeconomic History   Marital status: Divorced    Spouse name: Not on file   Number of children: Not on file   Years of education: Not on file   Highest education level: Not on file  Occupational History   Not on file  Tobacco Use   Smoking status: Never   Smokeless tobacco: Never  Substance and Sexual Activity   Alcohol use: Yes    Alcohol/week: 1.0 standard drink of alcohol    Types: 1 Glasses of wine per week    Comment: occas   Drug use: No   Sexual activity: Yes  Other Topics Concern   Not on file  Social History Narrative   DIVORCED   2 CHILDREN         Social Drivers of Health   Financial Resource Strain: Low Risk  (01/01/2024)   Overall Financial Resource Strain (CARDIA)    Difficulty of Paying Living Expenses: Not hard at all  Food Insecurity: No Food Insecurity (01/01/2024)   Hunger Vital Sign    Worried About Running Out of Food in the Last Year: Never true    Ran Out of Food in the Last Year: Never true  Transportation Needs: No Transportation Needs (01/01/2024)   PRAPARE - Administrator, Civil Service (Medical): No    Lack of Transportation (Non-Medical): No  Physical Activity: Insufficiently  Active (01/01/2024)   Exercise Vital Sign    Days of Exercise per Week: 3 days    Minutes of Exercise per Session: 30 min  Stress: No Stress Concern Present (01/01/2024)   [employer] of Occupational Health - Occupational Stress Questionnaire    Feeling of Stress: Only a little  Social Connections: Moderately Isolated (01/01/2024)   Social Connection and Isolation Panel    Frequency of Communication with Friends and Family: More than three times a week    Frequency of Social Gatherings with Friends and Family: Once a  week    Attends Religious Services: Never    Active Member of Clubs or Organizations: No    Attends Engineer, structural: Never    Marital Status: Married    Tobacco Counseling Counseling given: No    Clinical Intake:  Pre-visit preparation completed: Yes  Pain : No/denies pain     BMI - recorded: 22.86 Nutritional Status: BMI of 19-24  Normal Nutritional Risks: None, Unintentional weight loss Diabetes: No  No results found for: HGBA1C   How often do you need to have someone help you when you read instructions, pamphlets, or other written materials from your doctor or pharmacy?: 1 - Never  Interpreter Needed?: No  Information entered by :: Verdie Saba, CMA   Activities of Daily Living     01/01/2024   10:18 AM  In your present state of health, do you have any difficulty performing the following activities:  Hearing? 1  Comment exp some difficulty hearing but declines a referral to an Audiologist at this time  Vision? 0  Difficulty concentrating or making decisions? 0  Walking or climbing stairs? 0  Dressing or bathing? 0  Doing errands, shopping? 0  Preparing Food and eating ? N  Using the Toilet? N  In the past six months, have you accidently leaked urine? N  Do you have problems with loss of bowel control? N  Managing your Medications? N  Managing your Finances? N  Housekeeping or managing your Housekeeping? N    Patient  Care Team: Rollene Almarie LABOR, MD as PCP - General (Internal Medicine) Cleotilde Elspeth CROME, OD (Optometry)  I have updated your Care Teams any recent Medical Services you may have received from other providers in the past year.     Assessment:   This is a routine wellness examination for [facility]  Hearing/Vision screen Hearing Screening - Comments:: Has some difficulty hearing but declines a referral to an Audiologist at this time Vision Screening - Comments:: Wears rx glasses - up to date with routine eye exams with Dr Cleotilde   Goals Addressed               This Visit's Progress     Patient Stated (pt-stated)        Patient stated she plans to walk more        Depression Screen     01/01/2024   10:20 AM 02/10/2023   10:27 AM 11/14/2022   10:58 AM 11/05/2021    1:23 PM 11/02/2021   10:13 AM 11/02/2021   10:09 AM 10/16/2020   12:54 PM  PHQ 2/9 Scores  PHQ - 2 Score 0 0 0 1 0 0 0  PHQ- 9 Score 0  0 2       Fall Risk     01/01/2024   10:18 AM 04/02/2023    9:43 AM 02/10/2023   10:27 AM 11/14/2022   10:55 AM 11/05/2021    1:23 PM  Fall Risk   Falls in the past year? 0 0 0 1 0  Number falls in past yr: 0 0 0 0   Injury with Fall? 0 0 0 0   Risk for fall due to : No Fall Risks      Follow up Falls evaluation completed;Falls prevention discussed Falls evaluation completed Falls evaluation completed      MEDICARE RISK AT HOME:  Medicare Risk at Home Any stairs in or around the home?: Yes If so, are there any without handrails?: No Home  free of loose throw rugs in walkways, pet beds, electrical cords, etc?: Yes Adequate lighting in your home to reduce risk of falls?: Yes Life alert?: No Use of a cane, walker or w/c?: No Grab bars in the bathroom?: Yes Shower chair or bench in shower?: No Elevated toilet seat or a handicapped toilet?: Noate   TIMED UP AND GO:  Was the test performed?  No  Cognitive Function: 6CIT completed        01/01/2024   10:26 AM 11/14/2022   11:00  AM  6CIT Screen  What Year? 0 points 0 points  What month? 0 points 0 points  What time? 0 points 0 points  Count back from 20 0 points 0 points  Months in reverse 0 points 0 points  Repeat phrase 0 points 0 points  Total Score 0 points 0 points    Immunizations Immunization History  Administered Date(s) Administered   Fluad Quad(high Dose 65+) 04/01/2022   Hepatitis A 04/16/2004, 03/30/2007   Hepatitis B 10/04/2013, 11/04/2013, 01/24/2014   IPV 03/30/2007   Influenza, High Dose Seasonal PF 05/15/2016, 03/30/2020, 03/25/2021, 03/12/2023   Influenza-Unspecified 04/21/2015, 03/31/2017, 03/16/2018   Meningococcal polysaccharide vaccine (MPSV4) 03/30/2007   Moderna Covid-19 Fall Seasonal Vaccine 12yrs & older 03/12/2023   Moderna Sars-Covid-2 Vaccination 08/10/2019, 09/07/2019   PFIZER Comirnaty(Gray Top)Covid-19 Tri-Sucrose Vaccine 04/01/2022   Pfizer Covid-19 Vaccine Bivalent Booster 29yrs & up 03/25/2021   Pneumococcal Conjugate-13 08/02/2015   Pneumococcal Polysaccharide-23 08/01/2016   Td 04/16/2004, 10/13/2014   Tdap 10/13/2014   Typhoid Inactivated 03/30/2007, 08/10/2012, 10/13/2014   Yellow Fever 07/06/2015   Zoster Recombinant(Shingrix) 11/17/2017, 02/03/2018   Zoster, Live 11/04/2013    Screening Tests Health Maintenance  Topic Date Due   Hepatitis C Screening  Never done   MAMMOGRAM  04/16/2023   COVID-19 Vaccine (6 - Mixed Product risk 2024-25 season) 09/09/2023   INFLUENZA VACCINE  02/06/2024   DTaP/Tdap/Td (4 - Td or Tdap) 10/12/2024   Medicare Annual Wellness (AWV)  12/31/2024   Colonoscopy  12/24/2032   Pneumococcal Vaccine: 50+ Years  Completed   Hepatitis B Vaccines  Completed   DEXA SCAN  Completed   Zoster Vaccines- Shingrix  Completed   HPV VACCINES  Aged Out   Meningococcal B Vaccine  Aged Out    Health Maintenance  Health Maintenance Due  Topic Date Due   Hepatitis C Screening  Never done   MAMMOGRAM  04/16/2023   COVID-19 Vaccine (6 -  Mixed Product risk 2024-25 season) 09/09/2023   Health Maintenance Items Addressed:  Mammogram ordered, Labs Ordered: Hepatitis C Screening  I have recommended that this patient have a hearing exam with an Audiologist but she declines at this time. I have discussed the risks and benefits of this procedure with her. The patient verbalizes understanding.   Additional Screening:  Vision Screening: Recommended annual ophthalmology exams for early detection of glaucoma and other disorders of the eye. Would you like a referral to an eye doctor? No    Dental Screening: Recommended annual dental exams for proper oral hygiene  Community Resource Referral / Chronic Care Management: CRR required this visit?  No   CCM required this visit?  No   Plan:    I have personally reviewed and noted the following in the patient's chart:   Medical and social history Use of alcohol, tobacco or illicit drugs  Current medications and supplements including opioid prescriptions. Patient is not currently taking opioid prescriptions. Functional ability and status Nutritional status Physical  activity Advanced directives List of other physicians Hospitalizations, surgeries, and ER visits in previous 12 months Vitals Screenings to include cognitive, depression, and falls Referrals and appointments  In addition, I have reviewed and discussed with patient certain preventive protocols, quality metrics, and best practice recommendations. A written personalized care plan for preventive services as well as general preventive health recommendations were provided to patient.   Verdie CHRISTELLA Saba, CMA   01/01/2024   After Visit Summary: (Declined) Due to this being a telephonic visit, with patients personalized plan was offered to patient but patient Declined AVS at this time   Notes: Nothing significant to report at this time.

## 2024-01-01 NOTE — Patient Instructions (Addendum)
 Ms. Gutmann , Thank you for taking time out of your busy schedule to complete your Annual Wellness Visit with me. I enjoyed our conversation and look forward to speaking with you again next year. I, as well as your care team,  appreciate your ongoing commitment to your health goals. Please review the following plan we discussed and let me know if I can assist you in the future. Your Game plan/ To Do List    Referrals: If you haven't heard from the office you've been referred to, please reach out to them at the phone provided.  Ordered a Mammogram for 04/2024; Ordered a Hepatitis C Screening (Lab) Follow up Visits: Next Medicare AWV with our clinical staff: 01/03/2025   Have you seen your provider in the last 6 months (3 months if uncontrolled diabetes)? No Next Office Visit with your provider: 01/19/2024 - Physical  Clinician Recommendations:  Aim for 30 minutes of exercise or brisk walking, 6-8 glasses of water, and 5 servings of fruits and vegetables each day.       This is a list of the screening recommended for you and due dates:  Health Maintenance  Topic Date Due   Hepatitis C Screening  Never done   Mammogram  04/16/2023   COVID-19 Vaccine (6 - Mixed Product risk 2024-25 season) 09/09/2023   Flu Shot  02/06/2024   DTaP/Tdap/Td vaccine (4 - Td or Tdap) 10/12/2024   Medicare Annual Wellness Visit  12/31/2024   Colon Cancer Screening  12/24/2032   Pneumococcal Vaccine for age over 35  Completed   Hepatitis B Vaccine  Completed   DEXA scan (bone density measurement)  Completed   Zoster (Shingles) Vaccine  Completed   HPV Vaccine  Aged Out   Meningitis B Vaccine  Aged Out    Advanced directives: (In Chart) A copy of your advanced directives are scanned into your chart should your provider ever need it. Advance Care Planning is important because it:  [x]  Makes sure you receive the medical care that is consistent with your values, goals, and preferences  [x]  It provides guidance  to your family and loved ones and reduces their decisional burden about whether or not they are making the right decisions based on your wishes.  Follow the link provided in your after visit summary or read over the paperwork we have mailed to you to help you started getting your Advance Directives in place. If you need assistance in completing these, please reach out to us  so that we can help you!

## 2024-01-15 ENCOUNTER — Ambulatory Visit
Admission: RE | Admit: 2024-01-15 | Discharge: 2024-01-15 | Disposition: A | Discharge status: Home / Self Care | Source: Ambulatory Visit | Attending: Internal Medicine | Admitting: Internal Medicine

## 2024-01-15 DIAGNOSIS — Z853 Personal history of malignant neoplasm of breast: Secondary | ICD-10-CM

## 2024-01-15 DIAGNOSIS — Z1231 Encounter for screening mammogram for malignant neoplasm of breast: Secondary | ICD-10-CM | POA: Diagnosis not present

## 2024-01-19 ENCOUNTER — Ambulatory Visit (INDEPENDENT_AMBULATORY_CARE_PROVIDER_SITE_OTHER): Admitting: Internal Medicine

## 2024-01-19 ENCOUNTER — Encounter: Payer: Self-pay | Admitting: Internal Medicine

## 2024-01-19 VITALS — BP 112/74 | HR 95 | Temp 97.6°F | Ht 62.0 in | Wt 125.0 lb

## 2024-01-19 DIAGNOSIS — Z1159 Encounter for screening for other viral diseases: Secondary | ICD-10-CM

## 2024-01-19 DIAGNOSIS — Z0000 Encounter for general adult medical examination without abnormal findings: Secondary | ICD-10-CM

## 2024-01-19 DIAGNOSIS — E785 Hyperlipidemia, unspecified: Secondary | ICD-10-CM

## 2024-01-19 DIAGNOSIS — R030 Elevated blood-pressure reading, without diagnosis of hypertension: Secondary | ICD-10-CM

## 2024-01-19 DIAGNOSIS — M810 Age-related osteoporosis without current pathological fracture: Secondary | ICD-10-CM

## 2024-01-19 DIAGNOSIS — G319 Degenerative disease of nervous system, unspecified: Secondary | ICD-10-CM

## 2024-01-19 DIAGNOSIS — Z853 Personal history of malignant neoplasm of breast: Secondary | ICD-10-CM

## 2024-01-19 LAB — COMPREHENSIVE METABOLIC PANEL WITH GFR
ALT: 21 U/L (ref 0–35)
AST: 26 U/L (ref 0–37)
Albumin: 4.4 g/dL (ref 3.5–5.2)
Alkaline Phosphatase: 66 U/L (ref 39–117)
BUN: 10 mg/dL (ref 6–23)
CO2: 32 meq/L (ref 19–32)
Calcium: 9.7 mg/dL (ref 8.4–10.5)
Chloride: 102 meq/L (ref 96–112)
Creatinine, Ser: 0.82 mg/dL (ref 0.40–1.20)
GFR: 69.98 mL/min (ref >60.00)
Glucose, Bld: 76 mg/dL (ref 70–99)
Potassium: 3.8 meq/L (ref 3.5–5.1)
Sodium: 141 meq/L (ref 135–145)
Total Bilirubin: 1 mg/dL (ref 0.2–1.2)
Total Protein: 7.4 g/dL (ref 6.0–8.3)

## 2024-01-19 LAB — CBC
HCT: 38.9 % (ref 36.0–46.0)
Hemoglobin: 13.1 g/dL (ref 12.0–15.0)
MCHC: 33.7 g/dL (ref 30.0–36.0)
MCV: 87.5 fl (ref 78.0–100.0)
Platelets: 374 K/uL (ref 150.0–400.0)
RBC: 4.44 Mil/uL (ref 3.87–5.11)
RDW: 13.4 % (ref 11.5–15.5)
WBC: 6.5 K/uL (ref 4.0–10.5)

## 2024-01-19 LAB — LIPID PANEL
Cholesterol: 214 mg/dL — ABNORMAL HIGH (ref 0–200)
HDL: 56.9 mg/dL (ref >39.00)
LDL Cholesterol: 111 mg/dL — ABNORMAL HIGH (ref 0–99)
NonHDL: 157.16
Total CHOL/HDL Ratio: 4
Triglycerides: 233 mg/dL — ABNORMAL HIGH (ref 0.0–149.0)
VLDL: 46.6 mg/dL — ABNORMAL HIGH (ref 0.0–40.0)

## 2024-01-19 NOTE — Progress Notes (Unsigned)
   Subjective:   Patient ID: Jennifer Hopkins, female    DOB: 07/02/1951, 73 y.o.   MRN: [id number]  HPI The patient is here for physical.  PMH, [facility], social history reviewed and updated  Review of Systems  Constitutional: Negative.   HENT: Negative.    Eyes: Negative.   Respiratory:  Negative for cough, chest tightness and shortness of breath.   Cardiovascular:  Negative for chest pain, palpitations and leg swelling.  Gastrointestinal:  Negative for abdominal distention, abdominal pain, constipation, diarrhea, nausea and vomiting.  Musculoskeletal: Negative.   Skin: Negative.   Neurological: Negative.   Psychiatric/Behavioral: Negative.      Objective:  Physical Exam Constitutional:      Appearance: She is well-developed.  HENT:     Head: Normocephalic and atraumatic.  Cardiovascular:     Rate and Rhythm: Normal rate and regular rhythm.  Pulmonary:     Effort: Pulmonary effort is normal. No respiratory distress.     Breath sounds: Normal breath sounds. No wheezing or rales.  Abdominal:     General: Bowel sounds are normal. There is no distension.     Palpations: Abdomen is soft.     Tenderness: There is no abdominal tenderness. There is no rebound.  Musculoskeletal:     Cervical back: Normal range of motion.  Skin:    General: Skin is warm and dry.  Neurological:     Mental Status: She is alert and oriented to person, place, and time.     Coordination: Coordination normal.     Vitals:   01/19/24 1337  BP: 112/74  Pulse: 95  Temp: 97.6 F (36.4 C)  TempSrc: Temporal  SpO2: 95%  Weight: 125 lb (56.7 kg)  Height: 5' 2 (1.575 m)    Assessment & Plan:

## 2024-01-20 ENCOUNTER — Ambulatory Visit: Payer: Self-pay | Admitting: Internal Medicine

## 2024-01-20 ENCOUNTER — Encounter: Payer: Self-pay | Admitting: Internal Medicine

## 2024-01-20 LAB — HM MAMMOGRAPHY

## 2024-01-20 LAB — HEPATITIS C ANTIBODY: Hepatitis C Ab: NONREACTIVE

## 2024-01-20 NOTE — Assessment & Plan Note (Signed)
Checking lipid panel and adjust as needed pravastatin.

## 2024-01-20 NOTE — Assessment & Plan Note (Signed)
 BP at goal today off meds continue close monitoring.

## 2024-01-20 NOTE — Assessment & Plan Note (Signed)
 Flu shot yearly. Pneumonia complete. Shingrix complete. Tetanus up to date. Colonoscopy up to date. Mammogram up to date, pap smear aged out and dexa due this year. Counseled about sun safety and mole surveillance. Counseled about the dangers of distracted driving. Given 10 year screening recommendations.

## 2024-01-20 NOTE — Assessment & Plan Note (Signed)
 Recent mammogram not read yet will await results.

## 2024-01-20 NOTE — Assessment & Plan Note (Signed)
 Stable and she does have some memory difficulties. She is taking statin and stable. We can decide to do aricept  if needed in the future.

## 2024-01-20 NOTE — Assessment & Plan Note (Signed)
 DEXA due which is on drug holiday from prior fosamax  treatment. If significant change we will adjust treatment.

## 2024-01-22 ENCOUNTER — Ambulatory Visit: Payer: Self-pay | Admitting: Internal Medicine

## 2024-01-28 NOTE — Telephone Encounter (Signed)
 Copied from CRM [id number]. Topic: Clinical - Lab/Test Results >> Jan 28, 2024 10:39 AM [place] J wrote: Reason for CRM: Patient calling in for her lab results. I was able to successfully relay results.

## 2024-02-03 DIAGNOSIS — L718 Other rosacea: Secondary | ICD-10-CM | POA: Diagnosis not present

## 2024-02-03 DIAGNOSIS — L814 Other melanin hyperpigmentation: Secondary | ICD-10-CM | POA: Diagnosis not present

## 2024-02-03 DIAGNOSIS — D225 Melanocytic nevi of trunk: Secondary | ICD-10-CM | POA: Diagnosis not present

## 2024-02-03 DIAGNOSIS — L821 Other seborrheic keratosis: Secondary | ICD-10-CM | POA: Diagnosis not present

## 2024-03-10 DIAGNOSIS — K080 Exfoliation of teeth due to systemic causes: Secondary | ICD-10-CM | POA: Diagnosis not present

## 2024-03-23 DIAGNOSIS — K080 Exfoliation of teeth due to systemic causes: Secondary | ICD-10-CM | POA: Diagnosis not present

## 2024-04-02 ENCOUNTER — Other Ambulatory Visit: Payer: Self-pay | Admitting: Internal Medicine

## 2024-06-02 ENCOUNTER — Inpatient Hospital Stay: Payer: Medicare Other | Attending: Hematology and Oncology | Admitting: Hematology and Oncology

## 2024-06-02 VITALS — BP 130/90 | HR 101 | Temp 97.6°F | Resp 20 | Ht 62.0 in | Wt 122.0 lb

## 2024-06-02 DIAGNOSIS — Z923 Personal history of irradiation: Secondary | ICD-10-CM | POA: Insufficient documentation

## 2024-06-02 DIAGNOSIS — M810 Age-related osteoporosis without current pathological fracture: Secondary | ICD-10-CM | POA: Diagnosis not present

## 2024-06-02 DIAGNOSIS — Z9221 Personal history of antineoplastic chemotherapy: Secondary | ICD-10-CM | POA: Diagnosis not present

## 2024-06-02 DIAGNOSIS — Z853 Personal history of malignant neoplasm of breast: Secondary | ICD-10-CM | POA: Diagnosis not present

## 2024-06-02 NOTE — Assessment & Plan Note (Signed)
 Right breast cancer diagnosed in 2002 and treated with neoadjuvant chemotherapy followed by surgery followed by additional adjuvant chemotherapy followed by radiation and antiestrogen therapy that was completed in 2007.   Breast Cancer Surveillance: 1. Breast exam 06/02/2024: Benign 2. Mammogram 01/20/2024 benign. Postsurgical changes. Breast Density Category C.    Bone density 10/25/2020: T score -2.6: Osteoporosis: Continue with Fosamax  (T score used to be -3.2) Brain MRI 01/06/2023: Benign   patient loves to travel She will travel to [place] and to [place] recently.   Return to clinic in 1 year to follow with me

## 2024-06-02 NOTE — Progress Notes (Signed)
 Patient Care Team: Rollene Almarie LABOR, MD as PCP - General (Internal Medicine) Cleotilde Elspeth CROME, OD (Optometry)  DIAGNOSIS:  Encounter Diagnosis  Name Primary?   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   11/10/2000 Surgery   Sentinel lymph node study for lymph nodes negative   12/08/2000 - 01/28/2001 Neo-Adjuvant Chemotherapy   4 cycles of Adriamycin and Taxotere   02/06/2001 Surgery   Lumpectomy showed residual IMC and DCIS 1.5 cm   03/10/2001 - 04/20/2001 Chemotherapy   [facility] chemotherapy    Radiation Therapy   Radiation therapy to the right breast   05/10/2001 - 05/10/2006 Anti-estrogen oral therapy   Tamoxifen 20 mg daily for 5 years   01/09/2010 Relapse/Recurrence   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 Shannon     CHIEF COMPLIANT: Surveillance of breast cancer  HISTORY OF PRESENT ILLNESS:  History of Present Illness Jennifer Hopkins is a 73 year old female who presents for a routine follow-up regarding her breast cancer surveillance.  Her health is good with no breast lumps, pain, or discomfort. Her mammogram in July was clear, and she continues annual screening.     ALLERGIES:  has no known allergies.  MEDICATIONS:  Current Outpatient Medications  Medication Sig Dispense Refill   co-enzyme Q-10 30 MG capsule Take 30 mg by mouth 3 (three) times daily.     diphenhydrAMINE  (SOMINEX) 25 MG tablet Take 25 mg by mouth at bedtime as needed for allergies or sleep.     fexofenadine-pseudoephedrine  (ALLEGRA-D)  60-120 MG 12 hr tablet Take 1 tablet by mouth 2 (two) times daily.     Flaxseed, Linseed, (FLAX SEED OIL PO) Take 1 tablet by mouth daily.     ibuprofen (ADVIL,MOTRIN) 200 MG tablet Take 200 mg by mouth every 6 (six) hours as needed (pain).     loperamide  (IMODIUM  A-D) 2 MG capsule Take 1 capsule (2 mg total) by mouth 3 (three) times daily as needed for diarrhea or loose stools. 12 capsule 0   MAGNESIUM  PO Take by mouth.     milk thistle 175 MG tablet Take 175 mg by mouth daily.     Multiple Vitamin (MULTIVITAMIN) tablet Take 1 tablet by mouth daily.     ondansetron  (ZOFRAN ) 4 MG tablet Take 1 tablet (4 mg total) by mouth every 8 (eight) hours as needed for nausea or vomiting. 20 tablet 0   POTASSIUM PO Take by mouth.     pravastatin  (PRAVACHOL ) 40 MG tablet TAKE 1 TABLET BY MOUTH EVERY DAY 90 tablet 0   pseudoephedrine  (SUDAFED) 30 MG tablet Take 30 mg by mouth every 4 (four) hours as needed. Reported on 08/02/2015     No current facility-administered medications for this visit.    PHYSICAL EXAMINATION: ECOG PERFORMANCE STATUS: 1 - Symptomatic but completely ambulatory  Vitals:   06/02/24 0947  BP: (!) 130/90  Pulse: (!) 101  Resp: 20  Temp: 97.6 F (36.4 C)  SpO2: 100%   Filed Weights   06/02/24 0947  Weight: 122  lb (55.3 kg)    Physical Exam Breast exam: No palpable lumps or nodules   BREAST: No palpable lumps or nodules.  (exam performed in the presence of a chaperone)  LABORATORY DATA:  I have reviewed the data as listed    Latest Ref Rng & Units 01/19/2024    2:45 PM 04/07/2023    4:02 AM 04/06/2023    4:48 PM  CMP  Glucose 70 - 99 mg/dL 76  94  883   BUN 6 - 23 mg/dL 10  6  5    Creatinine 0.40 - 1.20 mg/dL 9.17  9.41  9.49   Sodium 135 - 145 mEq/L 141  130  130   Potassium 3.5 - 5.1 mEq/L 3.8  3.8  3.0   Chloride 96 - 112 mEq/L 102  98  93   CO2 19 - 32 mEq/L 32  21  28   Calcium 8.4 - 10.5 mg/dL 9.7  8.7  9.5   Total Protein 6.0 - 8.3 g/dL 7.4  6.6    Total  Bilirubin 0.2 - 1.2 mg/dL 1.0  1.4    Alkaline Phos 39 - 117 U/L 66  52    AST 0 - 37 U/L 26  24    ALT 0 - 35 U/L 21  13      Lab Results  Component Value Date   WBC 6.5 01/19/2024   HGB 13.1 01/19/2024   HCT 38.9 01/19/2024   MCV 87.5 01/19/2024   PLT 374.0 01/19/2024   NEUTROABS 6.0 03/10/2014    ASSESSMENT & PLAN:  HX: breast cancer Right breast cancer diagnosed in 2002 and treated with neoadjuvant chemotherapy followed by surgery followed by additional adjuvant chemotherapy followed by radiation and antiestrogen therapy that was completed in 2007.   Breast Cancer Surveillance: 1. Breast exam 06/02/2024: Benign 2. Mammogram 01/20/2024 benign. Postsurgical changes. Breast Density Category C.    Bone density 10/25/2020: T score -2.6: Osteoporosis: Continue with Fosamax  (T score used to be -3.2) Brain MRI 01/06/2023: Benign   patient loves to travel   Return to clinic in 1 year to follow with me    No orders of the defined types were placed in this encounter.  The patient has a good understanding of the overall plan. she agrees with it. she will call with any problems that may develop before the next visit here.  I personally spent a total of 30 minutes in the care of the patient today including preparing to see the patient, getting/reviewing separately obtained history, performing a medically appropriate exam/evaluation, counseling and educating, placing orders, referring and communicating with other health care professionals, documenting clinical information in the EHR, independently interpreting results, communicating results, and coordinating care.   Viinay K Brittain Hosie, MD 06/02/24

## 2024-07-09 ENCOUNTER — Other Ambulatory Visit: Payer: Self-pay | Admitting: Internal Medicine

## 2025-01-03 ENCOUNTER — Ambulatory Visit

## 2025-05-25 ENCOUNTER — Inpatient Hospital Stay: Admitting: Hematology and Oncology
